# Patient Record
Sex: Male | Born: 1970 | Race: White | Hispanic: No | Marital: Single | Smoking: Current every day smoker
Health system: Southern US, Community
[De-identification: ages and names within clinical notes are randomized; demographics above are authoritative.]

## PROBLEM LIST (undated history)

## (undated) DIAGNOSIS — J101 Influenza due to other identified influenza virus with other respiratory manifestations: Secondary | ICD-10-CM

## (undated) DIAGNOSIS — F419 Anxiety disorder, unspecified: Secondary | ICD-10-CM

## (undated) DIAGNOSIS — F32A Depression, unspecified: Secondary | ICD-10-CM

## (undated) DIAGNOSIS — F329 Major depressive disorder, single episode, unspecified: Secondary | ICD-10-CM

## (undated) HISTORY — PX: TONSILLECTOMY: SUR1361

## (undated) HISTORY — PX: CHOLECYSTECTOMY: SHX55

## (undated) HISTORY — PX: NECK SURGERY: SHX720

## (undated) HISTORY — PX: FRACTURE SURGERY: SHX138

---

## 2006-03-31 ENCOUNTER — Emergency Department: Payer: Self-pay | Admitting: Emergency Medicine

## 2006-04-09 ENCOUNTER — Emergency Department: Payer: Self-pay | Admitting: Emergency Medicine

## 2006-05-25 ENCOUNTER — Emergency Department: Payer: Self-pay | Admitting: Emergency Medicine

## 2007-10-30 ENCOUNTER — Inpatient Hospital Stay: Payer: Self-pay | Admitting: Vascular Surgery

## 2008-02-15 ENCOUNTER — Emergency Department: Payer: Self-pay | Admitting: Emergency Medicine

## 2008-03-21 ENCOUNTER — Emergency Department: Payer: Self-pay | Admitting: Emergency Medicine

## 2011-12-14 ENCOUNTER — Ambulatory Visit: Payer: Self-pay | Admitting: Surgery

## 2011-12-14 LAB — URINALYSIS, COMPLETE
Bacteria: NONE SEEN
Bilirubin,UR: NEGATIVE
Glucose,UR: NEGATIVE mg/dL (ref 0–75)
Ketone: NEGATIVE
Leukocyte Esterase: NEGATIVE
RBC,UR: NONE SEEN /HPF (ref 0–5)
Specific Gravity: 1.001 (ref 1.003–1.030)
Squamous Epithelial: NONE SEEN

## 2011-12-14 LAB — DRUG SCREEN, URINE
Cannabinoid 50 Ng, Ur ~~LOC~~: NEGATIVE (ref ?–50)
Cocaine Metabolite,Ur ~~LOC~~: NEGATIVE (ref ?–300)
MDMA (Ecstasy)Ur Screen: NEGATIVE (ref ?–500)
Methadone, Ur Screen: NEGATIVE (ref ?–300)
Phencyclidine (PCP) Ur S: NEGATIVE (ref ?–25)
Tricyclic, Ur Screen: NEGATIVE (ref ?–1000)

## 2011-12-14 LAB — COMPREHENSIVE METABOLIC PANEL
Albumin: 3.8 g/dL (ref 3.4–5.0)
Alkaline Phosphatase: 65 U/L (ref 50–136)
Anion Gap: 11 (ref 7–16)
BUN: 10 mg/dL (ref 7–18)
Calcium, Total: 8.8 mg/dL (ref 8.5–10.1)
Chloride: 104 mmol/L (ref 98–107)
Creatinine: 1.2 mg/dL (ref 0.60–1.30)
EGFR (Non-African Amer.): >60
Potassium: 3.8 mmol/L (ref 3.5–5.1)
SGOT(AST): 19 U/L (ref 15–37)
SGPT (ALT): 24 U/L
Sodium: 140 mmol/L (ref 136–145)
Total Protein: 7.3 g/dL (ref 6.4–8.2)

## 2011-12-14 LAB — CBC
HCT: 41.5 % (ref 40.0–52.0)
MCH: 33.7 pg (ref 26.0–34.0)
MCHC: 35.5 g/dL (ref 32.0–36.0)
MCV: 95 fL (ref 80–100)
RDW: 11.5 % (ref 11.5–14.5)

## 2011-12-14 LAB — CK TOTAL AND CKMB (NOT AT ARMC)
CK, Total: 77 U/L (ref 35–232)
CK-MB: 0.5 ng/mL (ref 0.5–3.6)

## 2011-12-14 LAB — LIPASE, BLOOD: Lipase: 167 U/L (ref 73–393)

## 2011-12-14 LAB — TROPONIN I: Troponin-I: <0.02 ng/mL

## 2011-12-17 ENCOUNTER — Emergency Department: Payer: Self-pay | Admitting: *Deleted

## 2011-12-17 LAB — COMPREHENSIVE METABOLIC PANEL
Anion Gap: 12 (ref 7–16)
Bilirubin,Total: 1 mg/dL (ref 0.2–1.0)
Calcium, Total: 8.8 mg/dL (ref 8.5–10.1)
Co2: 26 mmol/L (ref 21–32)
Creatinine: 1.14 mg/dL (ref 0.60–1.30)
EGFR (African American): >60
EGFR (Non-African Amer.): >60
Glucose: 81 mg/dL (ref 65–99)
Osmolality: 279 (ref 275–301)
Sodium: 140 mmol/L (ref 136–145)

## 2011-12-17 LAB — CBC
HCT: 41.5 % (ref 40.0–52.0)
HGB: 14.7 g/dL (ref 13.0–18.0)
MCHC: 35.4 g/dL (ref 32.0–36.0)
MCV: 94 fL (ref 80–100)
RBC: 4.4 10*6/uL (ref 4.40–5.90)
WBC: 13.1 10*3/uL — ABNORMAL HIGH (ref 3.8–10.6)

## 2012-02-20 ENCOUNTER — Emergency Department: Payer: Self-pay | Admitting: Internal Medicine

## 2012-02-28 ENCOUNTER — Emergency Department: Payer: Self-pay | Admitting: Emergency Medicine

## 2012-04-01 ENCOUNTER — Emergency Department: Payer: Self-pay | Admitting: Emergency Medicine

## 2012-05-27 ENCOUNTER — Emergency Department: Payer: Self-pay | Admitting: Emergency Medicine

## 2012-05-29 ENCOUNTER — Emergency Department: Payer: Self-pay | Admitting: Emergency Medicine

## 2012-05-31 ENCOUNTER — Emergency Department: Payer: Self-pay | Admitting: Emergency Medicine

## 2012-06-08 ENCOUNTER — Emergency Department: Payer: Self-pay | Admitting: Emergency Medicine

## 2012-10-22 ENCOUNTER — Emergency Department: Payer: Self-pay | Admitting: Emergency Medicine

## 2012-12-15 ENCOUNTER — Emergency Department: Payer: Self-pay | Admitting: Unknown Physician Specialty

## 2012-12-15 LAB — URINALYSIS, COMPLETE
Bilirubin,UR: NEGATIVE
Blood: NEGATIVE
Ketone: NEGATIVE
Leukocyte Esterase: NEGATIVE
Nitrite: NEGATIVE
Ph: 5 (ref 4.5–8.0)
Protein: NEGATIVE
WBC UR: 1 /HPF (ref 0–5)

## 2012-12-15 LAB — CBC
HCT: 44.1 % (ref 40.0–52.0)
MCH: 32.4 pg (ref 26.0–34.0)
Platelet: 241 10*3/uL (ref 150–440)
RBC: 4.67 10*6/uL (ref 4.40–5.90)
WBC: 10.2 10*3/uL (ref 3.8–10.6)

## 2012-12-15 LAB — COMPREHENSIVE METABOLIC PANEL
Albumin: 4 g/dL (ref 3.4–5.0)
Alkaline Phosphatase: 71 U/L (ref 50–136)
Anion Gap: 7 (ref 7–16)
BUN: 12 mg/dL (ref 7–18)
Chloride: 104 mmol/L (ref 98–107)
Co2: 28 mmol/L (ref 21–32)
EGFR (African American): >60
EGFR (Non-African Amer.): >60
Osmolality: 278 (ref 275–301)
SGOT(AST): 15 U/L (ref 15–37)
Sodium: 139 mmol/L (ref 136–145)
Total Protein: 7.6 g/dL (ref 6.4–8.2)

## 2012-12-15 LAB — LIPASE, BLOOD: Lipase: 264 U/L (ref 73–393)

## 2012-12-17 ENCOUNTER — Emergency Department: Payer: Self-pay | Admitting: Emergency Medicine

## 2012-12-24 ENCOUNTER — Emergency Department: Payer: Self-pay | Admitting: Emergency Medicine

## 2015-03-05 ENCOUNTER — Emergency Department
Admit: 2015-03-05 | Disposition: A | Discharge status: Home / Self Care | Payer: Self-pay | Admitting: Emergency Medicine

## 2015-03-05 LAB — BASIC METABOLIC PANEL
Anion Gap: 8 (ref 7–16)
BUN: 17 mg/dL
CREATININE: 0.92 mg/dL
Calcium, Total: 8.3 mg/dL — ABNORMAL LOW
Chloride: 98 mmol/L — ABNORMAL LOW
Co2: 26 mmol/L
EGFR (Non-African Amer.): >60
GLUCOSE: 141 mg/dL — AB
Potassium: 3.6 mmol/L
SODIUM: 132 mmol/L — AB

## 2015-03-05 LAB — CBC
HCT: 40.4 % (ref 40.0–52.0)
HGB: 13.8 g/dL (ref 13.0–18.0)
MCH: 31.4 pg (ref 26.0–34.0)
MCHC: 34.3 g/dL (ref 32.0–36.0)
MCV: 92 fL (ref 80–100)
PLATELETS: 228 10*3/uL (ref 150–440)
RBC: 4.41 10*6/uL (ref 4.40–5.90)
RDW: 12.9 % (ref 11.5–14.5)
WBC: 21.3 10*3/uL — ABNORMAL HIGH (ref 3.8–10.6)

## 2015-03-05 LAB — TROPONIN I: Troponin-I: <0.03 ng/mL

## 2015-03-07 NOTE — H&P (Signed)
PATIENT NAME:  Ruben [name], Ruben [name] MR#:  [id number] DATE OF BIRTH:  17-Oct-1971  DATE OF ADMISSION:  12/14/2011  HISTORY OF PRESENT ILLNESS: Ruben [name] is a 44 year old white male who was awoken this morning at 5:30 a.m. with an excruciating right upper quadrant pain. This has radiated to his back. He has not had any shoulder pain. He was sweaty when he woke up but does not have a documented fever. He had just a tiny bit of nausea with no vomiting. He has had some pain relief with IV morphine in the Emergency Room but says that it is still sore. He has never had an episode similar to this, even of lesser severity or lesser length of time.   PAST MEDICAL HISTORY: No medical illnesses.   MEDICATIONS: None.   ALLERGIES: No known drug allergies.   SOCIAL HISTORY: The patient is divorced and lives with his ex-wife. He does not have any children. 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 He smokes 1 pack of cigarettes per day and used to smoke more frequently than that. He denies any alcohol consumption.   FAMILY HISTORY: Noncontributory.   REVIEW OF SYSTEMS: Negative for 10 systems except as mentioned in the history of present illness above.   PHYSICAL EXAMINATION:  GENERAL: A middle-aged white male lying comfortably on the ED stretcher. Height is 6 feet 2 inches, weight 255 pounds. BMI 32.8.   VITAL SIGNS: Temperature 97.3, pulse 62, respirations 32, blood pressure 116/69, and oxygen saturation 99% on room air.   HEENT: Pupils are equally round and reactive to light. Extraocular movements are intact. Sclerae are anicteric. Oropharynx is clear. Mucous membranes are moist.   NECK: Supple with no lymphadenopathy. The trachea is midline, and there is no jugular venous distention.   HEART: Regular rate and rhythm with no murmurs or rubs.   LUNGS: Clear to auscultation with normal respiratory effort bilaterally.   ABDOMEN: Tender in the right upper  quadrant with no rebound tenderness or guarding.   EXTREMITIES: No edema with normal capillary refill bilaterally.   NEUROLOGIC: Cranial nerves II through XII, motor and sensation are grossly intact.   PSYCHIATRIC: Alert and oriented x4. Appropriate affect.   LABORATORY, DIAGNOSTIC AND RADIOLOGICAL DATA:  Urinalysis is normal.  Electrolytes, lipase, hepatic profile, CPK-MB, troponin, and CBC are all normal.  Chest x-ray is clear.  Right upper quadrant ultrasound reveals gallbladder wall thickening of 3.2 mm with no sonographic Murphy's sign, a common bile duct at 4.1 mm, and no pericholecystic fluid but lots of gallstones.   ASSESSMENT: Possible hydrops of the gallbladder or acute cholecystitis.   PLAN: Admit to the hospital for laparoscopic cholecystectomy. The patient understands the risk of a 1 in 200 chance of a common bile duct injury and realizes that if such an injury were to occur it would be a serious complication and require further surgery. He consents to the operation.   ____________________________ Ruben MangesWilliam F. Merit Maybee, MD wfm:cbb [name]: 12/14/2011 11:01:58 ET T: 12/14/2011 11:18:05 ET JOB#: [id number]  cc: Ruben MangesWilliam F. Ruben Gervase, MD, <Dictator> Ruben MangesWILLIAM F Gwendola Hornaday MD ELECTRONICALLY SIGNED 12/14/2011 12:27

## 2015-03-07 NOTE — Op Note (Signed)
PATIENT NAME:  Ruben [name], Ruben [name] MR#:  [id number] DATE OF BIRTH:  17-Sep-1971  DATE OF PROCEDURE:  12/14/2011  PREOPERATIVE DIAGNOSIS:  Acute cholecystitis.  POSTOPERATIVE DIAGNOSIS:  Acute cholecystitis.  OPERATION PERFORMED: Laparoscopic cholecystectomy.   SURGEON: Claude MangesWilliam F Chivas Notz, M.[name].   ANESTHESIA: General.   PROCEDURE IN DETAIL: The patient was placed supine on the operating room table and prepped and draped in the usual sterile fashion. A 15-mmHg CO2 pneumoperitoneum was created via a Veress needle in the infraumbilical position and this was replaced with a 5-mm trocar and a 30-degree angled laparoscope. Remaining trocars were placed under direct visualization. The patient was noted to have multiple accessory lobules on the visceral surface of his liver and his gallbladder was very large, mostly intrahepatic, and packed full of stones. There was significant edema in the triangle of Calot. The fundus of the gallbladder was retracted superiorly and ventrally. The infundibulum of the gallbladder was retracted laterally in order to dissect the triangle of Calot. The lymph node in the triangle was much larger than usual and it was included with the specimen, although it appeared to be fairly soft. Dissection within the triangle revealed a cystic duct/infundibular junction and the cystic duct was doubly clipped and divided. There were multiple small cystic artery branches. There was an anterior one, a posterior one, and then a third that was even more posterior. Each of these was doubly clipped and divided. The gallbladder was removed from the liver bed with electrocautery, placed in an Endo Catch bag, and extracted from the abdomen via the epigastric port. This port site fascia required significant enlargement due to the fact that the gallbladder was so widely packed full of stones. The peritoneum was temporarily desufflated and the fascia was closed with running #1 PDS suture. Then the peritoneum was  re-insufflated and the right upper quadrant inspected. Hemostasis was excellent. The area was irrigated with copious amounts of warm normal saline and this was all suctioned clear. The clips were secure. Unfortunately, 2 to 3 gallstones were left inside the patient as I had already removed my large port and they would not fit through the 5 mm ports. Each was about 1 cm in diameter. The peritoneum was then desufflated and decannulated and the midline epigastric incision was closed with staples and a sterile dressing. The other three 5-mm trocar sites were closed with subcuticular 5-0 Monocryl, Mastisol, and Steri-Strips. The patient tolerated the procedure well. There were no complications.     ____________________________ Claude MangesWilliam F. Jonna Dittrich, MD wfm:bjt [name]: 12/14/2011 16:03:02 ET T: 12/15/2011 10:59:19 ET JOB#: [id number]  cc: Claude MangesWilliam F. Husein Guedes, MD, <Dictator> Claude MangesWILLIAM F Estanislao Harmon MD ELECTRONICALLY SIGNED 12/21/2011 20:14

## 2015-03-07 NOTE — Consult Note (Signed)
PATIENT NAME:  Ruben [name], Ruben [name] MR#:  [id number] DATE OF BIRTH:  [birth year]  DATE OF CONSULTATION:  12/17/2011  REFERRING PHYSICIAN:   CONSULTING PHYSICIAN:  Carmie Endalph L. Ely III, MD  BRIEF HISTORY: Ruben [name] is a 44 year old gentleman with acute cholecystitis admitted to the hospital on 12/14/2011. He underwent laparoscopic cholecystectomy which identified significant inflammatory changes in his gallbladder. Cholangiogram was not performed. He did well initially with some moderate pain control issues. He has history of possible cocaine usage and his pain medication requirements had been quite high. He took a significant amount of medication while in the hospital and was discharged home on 12/15/2011. He returned to the Emergency Room today complaining of constipation. He has been unable to move his bowels over the last 48 hours. He lost his pain medication as his dog ate his pain medicine. Work-up in the Emergency Room revealed a slightly elevated white blood cell count and otherwise, his laboratory values were unremarkable. Plain films revealed an ileus pattern versus low partial small bowel obstruction.   PAST MEDICAL HISTORY: Unremarkable.   PAST SURGICAL HISTORY:  He has had no previous surgery.   MEDICATIONS: He takes no regular medications.   ALLERGIES: No known drug allergies.  SOCIAL HISTORY: He continues to smoke regularly. He is currently unemployed. He denies any alcohol consumption, but does use illicit drugs intermittently.   PHYSICAL EXAMINATION:  GENERAL: He is an alert and comfortable gentleman watching a movie on the computer when I entered the room.   VITAL SIGNS: Stable with a blood pressure 141/74, heart rate of 93, temperature of 96.4.   HEENT: Examination is unremarkable.   LYMPH: He has no axillary or cervical adenopathy.   CHEST: Clear with no adventitious sounds. He has normal pulmonary excursion.   CARDIAC: No murmurs or gallops to my ear and he seems to be in  normal sinus rhythm.   ABDOMEN: His abdomen is soft with well-healing midline wounds. There is no sign of any abdominal tenderness. He does not have any tympany or distention. He does have active bowel sounds.   LOWER EXTREMITY: Unremarkable.   PSYCHIATRIC: Normal affect and normal orientation. He does seem upset that he is having this problem.    IMPRESSION AND RECOMMENDATIONS: I have independently reviewed his laboratory values and his plain films. His white blood cell count is 13,000. Otherwise, his laboratories are unremarkable. Plain films do show an ileus versus low large bowel obstruction pattern. I do not see any indication clinically that he is an obstruction. I suspect that he simply has some obstipation from his chronic narcotic use. We will plan to give him some cathartics, enema if necessary and follow him up in the office. I have recommended that he reduce his pain medication consumption if possible, but at the present time he says his pain is intolerable. We will write him another prescription for Percocet.    ____________________________ Carmie Endalph L. Ely III, MD rle:ap [name]: 12/17/2011 10:10:51 ET T: 12/17/2011 10:55:41 ET JOB#: [id number]  cc: Quentin Orealph L. Ely III, MD, <Dictator> [name] ELECTRONICALLY SIGNED 12/24/2011 21:14

## 2015-10-06 ENCOUNTER — Emergency Department: Payer: Managed Care, Other (non HMO)

## 2015-10-06 ENCOUNTER — Emergency Department
Admission: EM | Admit: 2015-10-06 | Discharge: 2015-10-06 | Disposition: A | Discharge status: Home / Self Care | Payer: Managed Care, Other (non HMO) | Attending: Student | Admitting: Student

## 2015-10-06 ENCOUNTER — Encounter: Payer: Self-pay | Admitting: Emergency Medicine

## 2015-10-06 DIAGNOSIS — Y9389 Activity, other specified: Secondary | ICD-10-CM | POA: Insufficient documentation

## 2015-10-06 DIAGNOSIS — X58XXXA Exposure to other specified factors, initial encounter: Secondary | ICD-10-CM | POA: Diagnosis not present

## 2015-10-06 DIAGNOSIS — R109 Unspecified abdominal pain: Secondary | ICD-10-CM | POA: Diagnosis present

## 2015-10-06 DIAGNOSIS — Y998 Other external cause status: Secondary | ICD-10-CM | POA: Diagnosis not present

## 2015-10-06 DIAGNOSIS — Y9289 Other specified places as the place of occurrence of the external cause: Secondary | ICD-10-CM | POA: Insufficient documentation

## 2015-10-06 DIAGNOSIS — Z79899 Other long term (current) drug therapy: Secondary | ICD-10-CM | POA: Insufficient documentation

## 2015-10-06 DIAGNOSIS — F17210 Nicotine dependence, cigarettes, uncomplicated: Secondary | ICD-10-CM | POA: Diagnosis not present

## 2015-10-06 DIAGNOSIS — S39012A Strain of muscle, fascia and tendon of lower back, initial encounter: Secondary | ICD-10-CM

## 2015-10-06 LAB — CBC
HEMATOCRIT: 41.7 % (ref 40.0–52.0)
Hemoglobin: 14.5 g/dL (ref 13.0–18.0)
MCH: 32.3 pg (ref 26.0–34.0)
MCHC: 34.6 g/dL (ref 32.0–36.0)
MCV: 93.2 fL (ref 80.0–100.0)
PLATELETS: 199 10*3/uL (ref 150–440)
RBC: 4.48 MIL/uL (ref 4.40–5.90)
RDW: 12.7 % (ref 11.5–14.5)
WBC: 8.3 10*3/uL (ref 3.8–10.6)

## 2015-10-06 LAB — URINALYSIS COMPLETE WITH MICROSCOPIC (ARMC ONLY)
BILIRUBIN URINE: NEGATIVE
Bacteria, UA: NONE SEEN
Glucose, UA: NEGATIVE mg/dL
Hgb urine dipstick: NEGATIVE
Ketones, ur: NEGATIVE mg/dL
Leukocytes, UA: NEGATIVE
Nitrite: NEGATIVE
Protein, ur: NEGATIVE mg/dL
Specific Gravity, Urine: 1.021 (ref 1.005–1.030)
Squamous Epithelial / LPF: NONE SEEN
pH: 5 (ref 5.0–8.0)

## 2015-10-06 LAB — BASIC METABOLIC PANEL
Anion gap: 5 (ref 5–15)
BUN: 10 mg/dL (ref 6–20)
CO2: 26 mmol/L (ref 22–32)
Calcium: 8.8 mg/dL — ABNORMAL LOW (ref 8.9–10.3)
Chloride: 106 mmol/L (ref 101–111)
Creatinine, Ser: 0.74 mg/dL (ref 0.61–1.24)
GFR calc Af Amer: >60 mL/min (ref >60)
Glucose, Bld: 135 mg/dL — ABNORMAL HIGH (ref 65–99)
POTASSIUM: 3.5 mmol/L (ref 3.5–5.1)
Sodium: 137 mmol/L (ref 135–145)

## 2015-10-06 MED ORDER — ONDANSETRON HCL 4 MG/2ML IJ SOLN
4.0000 mg | Freq: Once | INTRAMUSCULAR | Status: AC
  Administered 2015-10-06: 4 mg via INTRAVENOUS
  Filled 2015-10-06: qty 2

## 2015-10-06 MED ORDER — SODIUM CHLORIDE 0.9 % IV BOLUS (SEPSIS)
1000.0000 mL | Freq: Once | INTRAVENOUS | Status: AC
  Administered 2015-10-06: 1000 mL via INTRAVENOUS

## 2015-10-06 MED ORDER — IBUPROFEN 600 MG PO TABS: 600.0000 mg | ORAL_TABLET | Freq: Four times a day (QID) | ORAL | Status: DC | PRN

## 2015-10-06 MED ORDER — KETOROLAC TROMETHAMINE 30 MG/ML IJ SOLN
30.0000 mg | Freq: Once | INTRAMUSCULAR | Status: AC
Start: 2015-10-06 — End: 2015-10-06
  Administered 2015-10-06: 30 mg via INTRAVENOUS
  Filled 2015-10-06: qty 1

## 2015-10-06 NOTE — ED Provider Notes (Signed)
[facility] Emergency Department Provider Note  ____________________________________________  Time seen: Approximately 11:50 AM  I have reviewed the triage vital signs and the nursing notes.   HISTORY  Chief Complaint Flank Pain    HPI Hadyn Blanck. is a 44 y.o. male with no chronic medical problem presents for evaluation of gradual onset left flank pain radiating to the left abdomen since yesterday, gradual onset, constant since onset, worse with movement, currently moderate. No nausea, vomiting, diarrhea, fevers or chills. No pain or burning with urination, no hematuria. No similar symptoms previously. No trauma. No numbness or weakness, no bowel or bladder incontinence, no personal history of malignancy, no history of IV drug use.   History reviewed. No pertinent past medical history.  There are no active problems to display for this patient.   Past Surgical History  Procedure Laterality Date  . Cholecystectomy    . Tonsillectomy      Current Outpatient Rx  Name  Route  Sig  Dispense  Refill  . busPIRone (BUSPAR) 10 MG tablet   Oral   Take 20 mg by mouth 2 (two) times daily.         Marland Kitchen FLUoxetine (PROZAC) 40 MG capsule   Oral   Take 40 mg by mouth at bedtime.         . risperiDONE (RISPERDAL) 1 MG tablet   Oral   Take 1 mg by mouth at bedtime.           Allergies Review of patient's allergies indicates no known allergies.  Family History  Problem Relation Age of Onset  . Diabetes Mother   . Diabetes Father     Social History Social History  Substance Use Topics  . Smoking status: Current Every Day Smoker -- 0.75 packs/day    Types: Cigarettes  . Smokeless tobacco: None  . Alcohol Use: No     Comment: quit drinking and using illegal drugs in 2013    Review of Systems Constitutional: No fever/chills Eyes: No visual changes. ENT: No sore throat. Cardiovascular: Denies chest pain. Respiratory: Denies shortness of  breath. Gastrointestinal: No abdominal pain.  No nausea, no vomiting.  No diarrhea.  No constipation. Genitourinary: Negative for dysuria. Musculoskeletal: Positive for left flank pain. Skin: Negative for rash. Neurological: Negative for headaches, focal weakness or numbness.  10-point ROS otherwise negative.  ____________________________________________   PHYSICAL EXAM:  VITAL SIGNS: ED Triage Vitals  Enc Vitals Group     BP 10/06/15 1105 127/79 mmHg     Pulse Rate 10/06/15 1105 79     Resp 10/06/15 1105 20     Temp 10/06/15 1105 98.2 F (36.8 C)     Temp Source 10/06/15 1105 Oral     SpO2 10/06/15 1105 94 %     Weight 10/06/15 1105 283 lb (128.368 kg)     Height 10/06/15 1105  (1.88 m)     Head Cir --      Peak Flow --      Pain Score 10/06/15 1106 7     Pain Loc --      Pain Edu? --      Excl. in GC? --     Constitutional: Alert and oriented. Well appearing and in no acute distress. Eyes: Conjunctivae are normal. PERRL. EOMI. Head: Atraumatic. Nose: No congestion/rhinnorhea. Mouth/Throat: Mucous membranes are moist.  Oropharynx non-erythematous. Neck: No stridor.   Cardiovascular: Normal rate, regular rhythm. Grossly normal heart sounds.  Good peripheral circulation.  Respiratory: Normal respiratory effort.  No retractions. Lungs CTAB. Gastrointestinal: Soft and nontender. No distention.   Genitourinary: deferred Musculoskeletal: No lower extremity tenderness nor edema.  No joint effusions. Severe tenderness to palpation throughout the left paravertebral muscles associated with the lumbar spine, no midline tenderness to palpation. Neurologic:  Normal speech and language. No gross focal neurologic deficits are appreciated. No gait instability. Normal dorsiflexion of the hallux toes bilaterally. Skin:  Skin is warm, dry and intact. No rash noted. Psychiatric: Mood and affect are normal. Speech and behavior are  normal.  ____________________________________________   LABS (all labs ordered are listed, but only abnormal results are displayed)  Labs Reviewed  BASIC METABOLIC PANEL - Abnormal; Notable for the following:    Glucose, Bld 135 (*)    Calcium 8.8 (*)    All other components within normal limits  URINALYSIS COMPLETEWITH MICROSCOPIC ([facility] ONLY) - Abnormal; Notable for the following:    Color, Urine YELLOW (*)    APPearance CLEAR (*)    All other components within normal limits  CBC   ____________________________________________  EKG  none ____________________________________________  RADIOLOGY  Renal ultrasound IMPRESSION: Normal appearance of both kidneys. No evidence of hydronephrosis.  Hepatic steatosis incidentally noted.  ____________________________________________   PROCEDURES  Procedure(s) performed: None  Critical Care performed: No  ____________________________________________   INITIAL IMPRESSION / ASSESSMENT AND PLAN / ED COURSE  Pertinent labs & imaging results that were available during my care of the patient were reviewed by me and considered in my medical decision making (see chart for details).  Darrelyn HillockGerald D Giammona Jr. is a 44 y.o. male with no chronic medical problem presents for evaluation of gradual onset left flank pain radiating to the left abdomen since yesterday, gradual onset, constant since onset, worse with movement. On exam, he is generally well-appearing and in no acute distress. Vital signs stable, he is afebrile. He does have tenderness to palpation throughout the paravertebral muscles of the left lumbar spine and I suspect his symptoms are secondary to lumbar strain. No red flags concerning for cauda equina or epidural abscess. He has normal strength exam in the legs bilaterally. No midline tenderness throughout the lumbar spine. He is afebrile. Differential also includes possible kidney stone. Urinalysis consistent with infection and  there are very few red blood cells making this less likely however will obtain renal ultrasound. We'll treat his pain. Reassess for disposition.  ----------------------------------------- 2:24 PM on 10/06/2015 -----------------------------------------  Labs reviewed. Normal CBC and BMP. Ultrasound negative for any evidence of hydronephrosis, normal appearance of the kidneys is noted. He appears well, he is hungry and requesting a sandwich. Discussed treatment of lumbar strain with anti-inflammatories, return precautions and need for close follow-up and he is comfortable with the discharge plan. ____________________________________________   FINAL CLINICAL IMPRESSION(S) / ED DIAGNOSES  Final diagnoses:  Lumbar strain, initial encounter      Gayla DossEryka A Macenzie Burford, MD 10/06/15 1544

## 2015-10-06 NOTE — ED Notes (Signed)
Pt c/o left flank pain that radiates to the front since yesterday with nausea.. Denies HX of kidney stones or vomiting

## 2016-02-15 ENCOUNTER — Emergency Department: Payer: Managed Care, Other (non HMO)

## 2016-02-15 ENCOUNTER — Encounter: Payer: Self-pay | Admitting: Emergency Medicine

## 2016-02-15 ENCOUNTER — Emergency Department
Admission: EM | Admit: 2016-02-15 | Discharge: 2016-02-15 | Disposition: A | Discharge status: Home / Self Care | Payer: Managed Care, Other (non HMO) | Attending: Emergency Medicine | Admitting: Emergency Medicine

## 2016-02-15 DIAGNOSIS — Z79899 Other long term (current) drug therapy: Secondary | ICD-10-CM | POA: Insufficient documentation

## 2016-02-15 DIAGNOSIS — F17210 Nicotine dependence, cigarettes, uncomplicated: Secondary | ICD-10-CM | POA: Diagnosis not present

## 2016-02-15 DIAGNOSIS — R05 Cough: Secondary | ICD-10-CM | POA: Diagnosis present

## 2016-02-15 DIAGNOSIS — J111 Influenza due to unidentified influenza virus with other respiratory manifestations: Secondary | ICD-10-CM | POA: Insufficient documentation

## 2016-02-15 DIAGNOSIS — R52 Pain, unspecified: Secondary | ICD-10-CM | POA: Insufficient documentation

## 2016-02-15 DIAGNOSIS — Z791 Long term (current) use of non-steroidal anti-inflammatories (NSAID): Secondary | ICD-10-CM | POA: Diagnosis not present

## 2016-02-15 MED ORDER — ACETAMINOPHEN 500 MG PO TABS
1000.0000 mg | ORAL_TABLET | Freq: Once | ORAL | Status: AC
  Administered 2016-02-15: 1000 mg via ORAL

## 2016-02-15 MED ORDER — ACETAMINOPHEN 500 MG PO TABS
ORAL_TABLET | ORAL | Status: AC
  Administered 2016-02-15: 1000 mg via ORAL
  Filled 2016-02-15: qty 2

## 2016-02-15 MED ORDER — OSELTAMIVIR PHOSPHATE 75 MG PO CAPS: 75.0000 mg | ORAL_CAPSULE | Freq: Two times a day (BID) | ORAL | Status: DC

## 2016-02-15 MED ORDER — PREDNISONE 10 MG PO TABS: 50.0000 mg | ORAL_TABLET | Freq: Every day | ORAL | Status: DC

## 2016-02-15 MED ORDER — ALBUTEROL SULFATE HFA 108 (90 BASE) MCG/ACT IN AERS: 2.0000 | INHALATION_SPRAY | Freq: Four times a day (QID) | RESPIRATORY_TRACT | Status: DC | PRN

## 2016-02-15 NOTE — ED Provider Notes (Signed)
CSN: [id number]     Arrival date & time 02/15/16  1617 History   First MD Initiated Contact with Patient 02/15/16 1643     Chief Complaint  Patient presents with  . Fever     HPI   45 year old male who complains of body aches with cough and headache since lunchtime today presents to the emergency department for evaluation.  History reviewed. No pertinent past medical history. Past Surgical History  Procedure Laterality Date  . Cholecystectomy    . Tonsillectomy     Family History  Problem Relation Age of Onset  . Diabetes Mother   . Diabetes Father    Social History  Substance Use Topics  . Smoking status: Current Every Day Smoker -- 0.75 packs/day    Types: Cigarettes  . Smokeless tobacco: None  . Alcohol Use: No     Comment: quit drinking and using illegal drugs in 2013    Review of Systems  Constitutional: Positive for fever, chills and fatigue.  HENT: Positive for rhinorrhea, sinus pressure, sneezing and sore throat.   Respiratory: Positive for cough. Negative for shortness of breath.   Gastrointestinal: Positive for nausea, vomiting and diarrhea.  Skin: Negative.       Allergies  Review of patient's allergies indicates no known allergies.  Home Medications   Prior to Admission medications   Medication Sig Start Date End Date Taking? Authorizing Provider  albuterol (PROVENTIL HFA;VENTOLIN HFA) 108 (90 Base) MCG/ACT inhaler Inhale 2 puffs into the lungs every 6 (six) hours as needed for wheezing or shortness of breath. 02/15/16   Chinita Pester, FNP  busPIRone (BUSPAR) 10 MG tablet Take 20 mg by mouth 2 (two) times daily.    Historical Provider, MD  FLUoxetine (PROZAC) 40 MG capsule Take 40 mg by mouth at bedtime.    Historical Provider, MD  ibuprofen (ADVIL,MOTRIN) 600 MG tablet Take 1 tablet (600 mg total) by mouth every 6 (six) hours as needed for moderate pain. 10/06/15   Gayla Doss, MD  oseltamivir (TAMIFLU) 75 MG capsule Take 1 capsule (75 mg total) by  mouth 2 (two) times daily. 02/15/16   Chinita Pester, FNP  predniSONE (DELTASONE) 10 MG tablet Take 5 tablets (50 mg total) by mouth daily. 02/15/16   Chinita Pester, FNP  risperiDONE (RISPERDAL) 1 MG tablet Take 1 mg by mouth at bedtime.    Historical Provider, MD   BP 120/53 mmHg  Pulse 120  Temp(Src) 101.6 F (38.7 C) (Oral)  Resp 20  Ht  (1.88 m)  Wt 128.368 kg  BMI 36.32 kg/m2  SpO2 98% Physical Exam  Constitutional: He is oriented to person, place, and time. He appears well-developed and well-nourished.  HENT:  Head: Normocephalic.  Right Ear: Tympanic membrane and external ear normal.  Left Ear: Tympanic membrane and external ear normal.  Nose: Mucosal edema present.  Mouth/Throat: Uvula is midline. No uvula swelling. Posterior oropharyngeal erythema present. No oropharyngeal exudate or tonsillar abscesses.  Eyes: Conjunctivae and EOM are normal.  Neck: Normal range of motion.  Cardiovascular: Normal rate and regular rhythm.   Pulmonary/Chest: Effort normal. He has wheezes.  Abdominal: Soft. There is no tenderness. There is no rebound and no guarding.  Musculoskeletal: Normal range of motion.  Lymphadenopathy:    He has cervical adenopathy.  Neurological: He is alert and oriented to person, place, and time.  Skin: Skin is warm and dry. He is not diaphoretic.  Psychiatric: His behavior is normal.  Nursing note  and vitals reviewed.   ED Course  Procedures (including critical care time) Labs Review Labs Reviewed - No data to display  Imaging Review Dg Chest 2 View  02/15/2016  CLINICAL DATA:  45 year old presenting with acute onset of cough, headache, fever and myalgias which began earlier today. EXAM: CHEST  2 VIEW COMPARISON:  03/05/2015 and earlier. FINDINGS: Cardiomediastinal silhouette unremarkable, unchanged. Prominent bronchovascular markings diffusely and moderate central peribronchial thickening, similar to the April, 2016 examination. Linear scarring in the  left lower lobe the site of the prior pneumonia. Lungs otherwise clear. No localized airspace consolidation. No pleural effusions. No pneumothorax. Normal pulmonary vascularity. Mild degenerative disc disease and spondylosis involving the mid lower thoracic spine. IMPRESSION: No acute cardiopulmonary disease. Stable moderate changes of chronic bronchitis and/or asthma. Linear scarring in the left lower lobe. Electronically Signed   By: Hulan Saashomas  Lawrence M.D.   On: 02/15/2016 17:15   I have personally reviewed and evaluated these images and lab results as part of my medical decision-making.   EKG Interpretation None      MDM   Final diagnoses:  Bronchitis with influenza    Because patient is an asthmatic and smokes cigarettes he will be started on Tamiflu and given prednisone and albuterol. He was advised that he needs to establish a primary care provider for follow-up. He was encouraged to take Tylenol or ibuprofen to help with body aches and fever. He was advised to return to emergency department for symptoms that change or worsen if he is unable to schedule an appointment.    Chinita PesterCari B Lajuana Patchell, FNP 02/15/16 2348  Sharman CheekPhillip Stafford, MD 02/17/16 [phone]

## 2016-02-15 NOTE — ED Notes (Signed)
Developed body aches with cough and headache and fever since lunch time

## 2016-02-15 NOTE — Discharge Instructions (Signed)

## 2016-12-02 ENCOUNTER — Emergency Department
Admission: EM | Admit: 2016-12-02 | Discharge: 2016-12-02 | Disposition: A | Discharge status: Home / Self Care | Payer: Managed Care, Other (non HMO) | Attending: Emergency Medicine | Admitting: Emergency Medicine

## 2016-12-02 ENCOUNTER — Encounter: Payer: Self-pay | Admitting: Emergency Medicine

## 2016-12-02 ENCOUNTER — Emergency Department: Payer: Managed Care, Other (non HMO)

## 2016-12-02 DIAGNOSIS — Z79899 Other long term (current) drug therapy: Secondary | ICD-10-CM | POA: Insufficient documentation

## 2016-12-02 DIAGNOSIS — J40 Bronchitis, not specified as acute or chronic: Secondary | ICD-10-CM | POA: Insufficient documentation

## 2016-12-02 DIAGNOSIS — F17210 Nicotine dependence, cigarettes, uncomplicated: Secondary | ICD-10-CM | POA: Insufficient documentation

## 2016-12-02 DIAGNOSIS — R079 Chest pain, unspecified: Secondary | ICD-10-CM

## 2016-12-02 LAB — CBC
HEMATOCRIT: 44.4 % (ref 40.0–52.0)
HEMOGLOBIN: 15.6 g/dL (ref 13.0–18.0)
MCH: 32.1 pg (ref 26.0–34.0)
MCHC: 35.1 g/dL (ref 32.0–36.0)
MCV: 91.5 fL (ref 80.0–100.0)
Platelets: 210 10*3/uL (ref 150–440)
RBC: 4.85 MIL/uL (ref 4.40–5.90)
RDW: 12.8 % (ref 11.5–14.5)
WBC: 10.9 10*3/uL — AB (ref 3.8–10.6)

## 2016-12-02 LAB — BASIC METABOLIC PANEL
ANION GAP: 5 (ref 5–15)
BUN: 10 mg/dL (ref 6–20)
CHLORIDE: 106 mmol/L (ref 101–111)
CO2: 27 mmol/L (ref 22–32)
Calcium: 8.9 mg/dL (ref 8.9–10.3)
Creatinine, Ser: 0.94 mg/dL (ref 0.61–1.24)
GFR calc Af Amer: >60 mL/min (ref >60)
Glucose, Bld: 112 mg/dL — ABNORMAL HIGH (ref 65–99)
POTASSIUM: 4.3 mmol/L (ref 3.5–5.1)
SODIUM: 138 mmol/L (ref 135–145)

## 2016-12-02 LAB — TROPONIN I: Troponin I: <0.03 ng/mL (ref <0.03)

## 2016-12-02 MED ORDER — AZITHROMYCIN 500 MG PO TABS
500.0000 mg | ORAL_TABLET | Freq: Once | ORAL | Status: AC
  Administered 2016-12-02: 500 mg via ORAL
  Filled 2016-12-02: qty 1

## 2016-12-02 MED ORDER — AZITHROMYCIN 250 MG PO TABS: 250.0000 mg | ORAL_TABLET | Freq: Every day | ORAL | 0 refills | Status: DC

## 2016-12-02 MED ORDER — GUAIFENESIN-CODEINE 100-10 MG/5ML PO SOLN
5.0000 mL | Freq: Four times a day (QID) | ORAL | 0 refills | Status: DC | PRN
Start: 2016-12-02 — End: 2019-02-10

## 2016-12-02 MED ORDER — HYDROCOD POLST-CPM POLST ER 10-8 MG/5ML PO SUER
5.0000 mL | Freq: Once | ORAL | Status: AC
  Administered 2016-12-02: 5 mL via ORAL
  Filled 2016-12-02: qty 5

## 2016-12-02 NOTE — ED Notes (Signed)

## 2016-12-02 NOTE — ED Triage Notes (Signed)
Patient c/o chest pain that started this morning around 0830. Patient states that he also has cough and shortness of breath. Patient states that the pain is worse when he cough. Denies fevers or chills, has been around sick contacts. Patient is in NAD at this time, breathing is equal and unlabored, color WNL. Patient A & O x 4

## 2016-12-02 NOTE — ED Provider Notes (Signed)
[facility] Emergency Department Provider Note  Time seen: 1:21 PM  I have reviewed the triage vital signs and the nursing notes.   HISTORY  Chief Complaint Chest Pain    HPI Ruben [name] is a 46 y.o. male presents the emergency department for shortness of breath, cough and left-sided chest pain with cough. According to the patient for the past 3 days he has been coughing, states a dry cough with no sputum production. Denies fever. States significant chest pain when coughing. Patient does smoke approximately one pack of cigarettes per day. Denies any nausea or diaphoresis. Denies any leg pain or swelling.  History reviewed. No pertinent past medical history.  There are no active problems to display for this patient.   Past Surgical History:  Procedure Laterality Date  . CHOLECYSTECTOMY    . TONSILLECTOMY      Prior to Admission medications   Medication Sig Start Date End Date Taking? Authorizing Provider  albuterol (PROVENTIL HFA;VENTOLIN HFA) 108 (90 Base) MCG/ACT inhaler Inhale 2 puffs into the lungs every 6 (six) hours as needed for wheezing or shortness of breath. 02/15/16   Chinita Pesterari B Triplett, FNP  busPIRone (BUSPAR) 10 MG tablet Take 20 mg by mouth 2 (two) times daily.    Historical Provider, MD  FLUoxetine (PROZAC) 40 MG capsule Take 40 mg by mouth at bedtime.    Historical Provider, MD  ibuprofen (ADVIL,MOTRIN) 600 MG tablet Take 1 tablet (600 mg total) by mouth every 6 (six) hours as needed for moderate pain. 10/06/15   Gayla DossEryka A Gayle, MD  oseltamivir (TAMIFLU) 75 MG capsule Take 1 capsule (75 mg total) by mouth 2 (two) times daily. 02/15/16   Chinita Pesterari B Triplett, FNP  predniSONE (DELTASONE) 10 MG tablet Take 5 tablets (50 mg total) by mouth daily. 02/15/16   Chinita Pesterari B Triplett, FNP  risperiDONE (RISPERDAL) 1 MG tablet Take 1 mg by mouth at bedtime.    Historical Provider, MD    No Known Allergies  Family History  Problem Relation Age of Onset  . Diabetes  Mother   . Diabetes Father     Social History Social History  Substance Use Topics  . Smoking status: Current Every Day Smoker    Packs/day: 0.75    Types: Cigarettes  . Smokeless tobacco: Never Used  . Alcohol use No     Comment: quit drinking and using illegal drugs in 2013    Review of Systems Constitutional: Negative for fever Cardiovascular: Positive for chest pain with cough. Respiratory: Negative for shortness of breath. Positive for cough. Gastrointestinal: Negative for abdominal pain Musculoskeletal: Negative for back pain Neurological: Negative for headache 10-point ROS otherwise negative.  ____________________________________________   PHYSICAL EXAM:  VITAL SIGNS: ED Triage Vitals  Enc Vitals Group     BP 12/02/16 0954 119/76     Pulse Rate 12/02/16 0954 81     Resp 12/02/16 0954 20     Temp 12/02/16 0954 98.2 F (36.8 C)     Temp Source 12/02/16 0954 Oral     SpO2 12/02/16 0954 97 %     Weight 12/02/16 0953 264 lb (119.7 kg)     Height 12/02/16 0953 6\' 2"  (1.88 m)     Head Circumference --      Peak Flow --      Pain Score 12/02/16 0953 6     Pain Loc --      Pain Edu? --      Excl. in  GC? --     Constitutional: Alert and oriented. Well appearing and in no distress. Eyes: Normal exam ENT   Head: Normocephalic and atraumatic   Mouth/Throat: Mucous membranes are moist. Cardiovascular: Normal rate, regular rhythm. No murmur Respiratory: Normal respiratory effort without tachypnea nor retractions. Breath sounds are clear and equal bilaterally. No wheezes/rales/rhonchi. Gastrointestinal: Soft and nontender. No distention.   Musculoskeletal: Nontender with normal range of motion in all extremities.  Neurologic:  Normal speech and language. No gross focal neurologic deficits Skin:  Skin is warm, dry and intact.  Psychiatric: Mood and affect are normal.   ____________________________________________    EKG  EKG reviewed and interpreted by  myself shows normal sinus rhythm at 84 bpm. Narrow QRS, normal axis, normal intervals, no CT changes. Normal EKG.  ____________________________________________    RADIOLOGY  Chest x-ray negative  ____________________________________________   INITIAL IMPRESSION / ASSESSMENT AND PLAN / ED COURSE  Pertinent labs & imaging results that were available during my care of the patient were reviewed by me and considered in my medical decision making (see chart for details).  Patient presents the emergency department with cough chest pain with cough and shortness of breath. Patient lung sounds are overall clear. Patient does have a frequent dry cough during examination. Suspect possible bronchitis. We will cover with antibiotics and cough medication. Patient agreeable to plan. Labs are normal including troponin.  ____________________________________________   FINAL CLINICAL IMPRESSION(S) / ED DIAGNOSES  Bronchitis    Minna Antis, MD 12/02/16 1334

## 2016-12-02 NOTE — Discharge Instructions (Signed)
You have been seen in the emergency department today for chest pain. Your workup has shown normal results and is most consistent with bronchitis. As we discussed please follow-up with your primary care physician in the next 1-2 days for recheck. Return to the emergency department for any further chest pain, trouble breathing, or any other symptom personally concerning to yourself. °

## 2016-12-03 ENCOUNTER — Encounter: Payer: Self-pay | Admitting: Emergency Medicine

## 2016-12-03 ENCOUNTER — Observation Stay
Admission: EM | Admit: 2016-12-03 | Discharge: 2016-12-05 | Disposition: A | Discharge status: Home / Self Care | Payer: Self-pay | Attending: Internal Medicine | Admitting: Internal Medicine

## 2016-12-03 ENCOUNTER — Emergency Department: Payer: Self-pay

## 2016-12-03 DIAGNOSIS — R000 Tachycardia, unspecified: Secondary | ICD-10-CM | POA: Insufficient documentation

## 2016-12-03 DIAGNOSIS — J440 Chronic obstructive pulmonary disease with acute lower respiratory infection: Secondary | ICD-10-CM | POA: Insufficient documentation

## 2016-12-03 DIAGNOSIS — F17210 Nicotine dependence, cigarettes, uncomplicated: Secondary | ICD-10-CM | POA: Insufficient documentation

## 2016-12-03 DIAGNOSIS — Z79899 Other long term (current) drug therapy: Secondary | ICD-10-CM | POA: Insufficient documentation

## 2016-12-03 DIAGNOSIS — J101 Influenza due to other identified influenza virus with other respiratory manifestations: Secondary | ICD-10-CM

## 2016-12-03 DIAGNOSIS — R509 Fever, unspecified: Secondary | ICD-10-CM

## 2016-12-03 DIAGNOSIS — Z9049 Acquired absence of other specified parts of digestive tract: Secondary | ICD-10-CM | POA: Insufficient documentation

## 2016-12-03 DIAGNOSIS — J209 Acute bronchitis, unspecified: Secondary | ICD-10-CM | POA: Insufficient documentation

## 2016-12-03 DIAGNOSIS — I959 Hypotension, unspecified: Secondary | ICD-10-CM | POA: Insufficient documentation

## 2016-12-03 DIAGNOSIS — Z833 Family history of diabetes mellitus: Secondary | ICD-10-CM | POA: Insufficient documentation

## 2016-12-03 DIAGNOSIS — J111 Influenza due to unidentified influenza virus with other respiratory manifestations: Principal | ICD-10-CM | POA: Insufficient documentation

## 2016-12-03 DIAGNOSIS — F419 Anxiety disorder, unspecified: Secondary | ICD-10-CM | POA: Insufficient documentation

## 2016-12-03 DIAGNOSIS — F329 Major depressive disorder, single episode, unspecified: Secondary | ICD-10-CM | POA: Insufficient documentation

## 2016-12-03 DIAGNOSIS — A419 Sepsis, unspecified organism: Secondary | ICD-10-CM

## 2016-12-03 HISTORY — DX: Anxiety disorder, unspecified: F41.9

## 2016-12-03 HISTORY — DX: Influenza due to other identified influenza virus with other respiratory manifestations: J10.1

## 2016-12-03 HISTORY — DX: Major depressive disorder, single episode, unspecified: F32.9

## 2016-12-03 HISTORY — DX: Depression, unspecified: F32.A

## 2016-12-03 LAB — URINALYSIS, COMPLETE (UACMP) WITH MICROSCOPIC
BILIRUBIN URINE: NEGATIVE
Bacteria, UA: NONE SEEN
GLUCOSE, UA: NEGATIVE mg/dL
HGB URINE DIPSTICK: NEGATIVE
KETONES UR: NEGATIVE mg/dL
LEUKOCYTES UA: NEGATIVE
NITRITE: NEGATIVE
PH: 5 (ref 5.0–8.0)
PROTEIN: NEGATIVE mg/dL
Specific Gravity, Urine: 1.018 (ref 1.005–1.030)
Squamous Epithelial / LPF: NONE SEEN

## 2016-12-03 LAB — COMPREHENSIVE METABOLIC PANEL
ALK PHOS: 55 U/L (ref 38–126)
ALT: 25 U/L (ref 17–63)
AST: 33 U/L (ref 15–41)
Albumin: 3.7 g/dL (ref 3.5–5.0)
Anion gap: 5 (ref 5–15)
BILIRUBIN TOTAL: 0.5 mg/dL (ref 0.3–1.2)
BUN: 8 mg/dL (ref 6–20)
CALCIUM: 8.4 mg/dL — AB (ref 8.9–10.3)
CO2: 29 mmol/L (ref 22–32)
CREATININE: 1.05 mg/dL (ref 0.61–1.24)
Chloride: 100 mmol/L — ABNORMAL LOW (ref 101–111)
GFR calc Af Amer: >60 mL/min (ref >60)
Glucose, Bld: 96 mg/dL (ref 65–99)
POTASSIUM: 4.2 mmol/L (ref 3.5–5.1)
Sodium: 134 mmol/L — ABNORMAL LOW (ref 135–145)
TOTAL PROTEIN: 7 g/dL (ref 6.5–8.1)

## 2016-12-03 LAB — INFLUENZA PANEL BY PCR (TYPE A & B)
Influenza A By PCR: POSITIVE — AB
Influenza B By PCR: NEGATIVE

## 2016-12-03 LAB — CBC
HCT: 41.5 % (ref 40.0–52.0)
Hemoglobin: 14.6 g/dL (ref 13.0–18.0)
MCH: 31.8 pg (ref 26.0–34.0)
MCHC: 35.2 g/dL (ref 32.0–36.0)
MCV: 90.4 fL (ref 80.0–100.0)
PLATELETS: 180 10*3/uL (ref 150–440)
RBC: 4.59 MIL/uL (ref 4.40–5.90)
RDW: 12.6 % (ref 11.5–14.5)
WBC: 8.5 10*3/uL (ref 3.8–10.6)

## 2016-12-03 LAB — LACTIC ACID, PLASMA: LACTIC ACID, VENOUS: 1.3 mmol/L (ref 0.5–1.9)

## 2016-12-03 MED ORDER — ACETAMINOPHEN 325 MG PO TABS
ORAL_TABLET | ORAL | Status: AC
  Filled 2016-12-03: qty 2

## 2016-12-03 MED ORDER — SODIUM CHLORIDE 0.9 % IV BOLUS (SEPSIS)
1000.0000 mL | Freq: Once | INTRAVENOUS | Status: AC
  Administered 2016-12-03: 1000 mL via INTRAVENOUS

## 2016-12-03 MED ORDER — ALBUTEROL SULFATE (2.5 MG/3ML) 0.083% IN NEBU: 3.0000 mL | INHALATION_SOLUTION | Freq: Four times a day (QID) | RESPIRATORY_TRACT | Status: DC | PRN

## 2016-12-03 MED ORDER — FLUOXETINE HCL 20 MG PO CAPS
40.0000 mg | ORAL_CAPSULE | Freq: Every day | ORAL | Status: DC
  Filled 2016-12-03: qty 2

## 2016-12-03 MED ORDER — OSELTAMIVIR PHOSPHATE 75 MG PO CAPS
75.0000 mg | ORAL_CAPSULE | Freq: Two times a day (BID) | ORAL | Status: DC
Start: 2016-12-03 — End: 2016-12-05
  Administered 2016-12-03 – 2016-12-05 (×4): 75 mg via ORAL
  Filled 2016-12-03 (×4): qty 1

## 2016-12-03 MED ORDER — GUAIFENESIN-CODEINE 100-10 MG/5ML PO SOLN
5.0000 mL | Freq: Four times a day (QID) | ORAL | Status: DC | PRN
  Administered 2016-12-03 – 2016-12-04 (×5): 5 mL via ORAL
  Filled 2016-12-03 (×5): qty 5

## 2016-12-03 MED ORDER — ONDANSETRON HCL 4 MG/2ML IJ SOLN: 4.0000 mg | Freq: Four times a day (QID) | INTRAMUSCULAR | Status: DC | PRN

## 2016-12-03 MED ORDER — BUSPIRONE HCL 10 MG PO TABS
20.0000 mg | ORAL_TABLET | Freq: Two times a day (BID) | ORAL | Status: DC
  Administered 2016-12-03: 20 mg via ORAL
  Filled 2016-12-03 (×3): qty 2

## 2016-12-03 MED ORDER — SODIUM CHLORIDE 0.9 % IV SOLN
INTRAVENOUS | Status: DC
  Administered 2016-12-03 – 2016-12-05 (×4): via INTRAVENOUS

## 2016-12-03 MED ORDER — ACETAMINOPHEN 650 MG RE SUPP: 650.0000 mg | Freq: Four times a day (QID) | RECTAL | Status: DC | PRN

## 2016-12-03 MED ORDER — RISPERIDONE 0.5 MG PO TABS
1.0000 mg | ORAL_TABLET | Freq: Every day | ORAL | Status: DC
  Filled 2016-12-03: qty 2

## 2016-12-03 MED ORDER — ACETAMINOPHEN 325 MG PO TABS
650.0000 mg | ORAL_TABLET | Freq: Four times a day (QID) | ORAL | Status: DC | PRN
  Administered 2016-12-03 (×2): 650 mg via ORAL
  Filled 2016-12-03 (×2): qty 2

## 2016-12-03 MED ORDER — KETOROLAC TROMETHAMINE 30 MG/ML IJ SOLN
30.0000 mg | Freq: Once | INTRAMUSCULAR | Status: AC
  Administered 2016-12-03: 30 mg via INTRAVENOUS

## 2016-12-03 MED ORDER — ONDANSETRON HCL 4 MG PO TABS: 4.0000 mg | ORAL_TABLET | Freq: Four times a day (QID) | ORAL | Status: DC | PRN

## 2016-12-03 MED ORDER — AZITHROMYCIN 250 MG PO TABS
250.0000 mg | ORAL_TABLET | Freq: Every day | ORAL | Status: DC
  Administered 2016-12-03 – 2016-12-05 (×3): 250 mg via ORAL
  Filled 2016-12-03 (×3): qty 1

## 2016-12-03 MED ORDER — OSELTAMIVIR PHOSPHATE 75 MG PO CAPS
75.0000 mg | ORAL_CAPSULE | Freq: Once | ORAL | Status: AC
  Administered 2016-12-03: 75 mg via ORAL

## 2016-12-03 MED ORDER — HEPARIN SODIUM (PORCINE) 5000 UNIT/ML IJ SOLN
5000.0000 [IU] | Freq: Three times a day (TID) | INTRAMUSCULAR | Status: DC
  Administered 2016-12-04: 5000 [IU] via SUBCUTANEOUS
  Filled 2016-12-03 (×3): qty 1

## 2016-12-03 MED ORDER — BISACODYL 10 MG RE SUPP: 10.0000 mg | Freq: Every day | RECTAL | Status: DC | PRN

## 2016-12-03 MED ORDER — ACETAMINOPHEN 325 MG PO TABS
650.0000 mg | ORAL_TABLET | Freq: Once | ORAL | Status: AC | PRN
  Administered 2016-12-03: 650 mg via ORAL

## 2016-12-03 NOTE — ED Notes (Signed)
Syncopal episode this AM, body aches, productive cough and chills.

## 2016-12-03 NOTE — H&P (Signed)
History and Physical    Ruben [name] ZOX:[id number] DOB: 1971/09/27 DOA: 12/03/2016  Referring physician: Dr. Don Perking PCP: No primary care provider on file.  Specialists: none  Chief Complaint: fever and malaise  HPI: Ruben [name] is a 46 y.o. male has a past medical history significant for asthma and depression here with acute fever and malaise with diffuse myalgias. In ER, pt tachycardic and hypotensive despite 2L IV fluids. Remains febrile. He is now admitted. Some diarrhea. No N/V. Denies CP or SOB. Does have slight cough  Review of Systems: The patient denies anorexia, weight loss,, vision loss, decreased hearing, hoarseness, chest pain, syncope, dyspnea on exertion, peripheral edema, balance deficits, hemoptysis, abdominal pain, melena, hematochezia, severe indigestion/heartburn, hematuria, incontinence, genital sores, muscle weakness, suspicious skin lesions, transient blindness, difficulty walking, depression, unusual weight change, abnormal bleeding, enlarged lymph nodes, angioedema, and breast masses.   History reviewed. No pertinent past medical history. Past Surgical History:  Procedure Laterality Date  . CHOLECYSTECTOMY    . TONSILLECTOMY     Social History:  reports that he has been smoking Cigarettes.  He has been smoking about 0.75 packs per day. He has never used smokeless tobacco. He reports that he does not drink alcohol or use drugs.  No Known Allergies  Family History  Problem Relation Age of Onset  . Diabetes Mother   . Diabetes Father     Prior to Admission medications   Medication Sig Start Date End Date Taking? Authorizing Provider  albuterol (PROVENTIL HFA;VENTOLIN HFA) 108 (90 Base) MCG/ACT inhaler Inhale 2 puffs into the lungs every 6 (six) hours as needed for wheezing or shortness of breath. 02/15/16   Chinita Pester, FNP  azithromycin (ZITHROMAX) 250 MG tablet Take 1 tablet (250 mg total) by mouth daily. 12/02/16   Minna Antis, MD   busPIRone (BUSPAR) 10 MG tablet Take 20 mg by mouth 2 (two) times daily.    Historical Provider, MD  FLUoxetine (PROZAC) 40 MG capsule Take 40 mg by mouth at bedtime.    Historical Provider, MD  guaiFENesin-codeine 100-10 MG/5ML syrup Take 5 mLs by mouth every 6 (six) hours as needed for cough. 12/02/16   Minna Antis, MD  ibuprofen (ADVIL,MOTRIN) 600 MG tablet Take 1 tablet (600 mg total) by mouth every 6 (six) hours as needed for moderate pain. 10/06/15   Gayla Doss, MD  oseltamivir (TAMIFLU) 75 MG capsule Take 1 capsule (75 mg total) by mouth 2 (two) times daily. 02/15/16   Chinita Pester, FNP  predniSONE (DELTASONE) 10 MG tablet Take 5 tablets (50 mg total) by mouth daily. 02/15/16   Chinita Pester, FNP  risperiDONE (RISPERDAL) 1 MG tablet Take 1 mg by mouth at bedtime.    Historical Provider, MD   Physical Exam: Vitals:   12/03/16 1030 12/03/16 1053 12/03/16 1130 12/03/16 1200  BP: (!) 95/59  (!) 102/53 99/61  Pulse: (!) 105  (!) 101 90  Resp: (!) 24     Temp:  (!) 100.4 F (38 C)    TempSrc:  Oral    SpO2: 98%  94% 97%  Weight:      Height:         General:  Ill appearing in moderate distress, WDWN, [place]/AT  Eyes: PERRL, EOMI, no scleral icterus, conjunctiva clear  ENT: moist oropharynx without exudate, TM's benign, dentition good  Neck: supple, no lymphadenopathy. No bruits or thyromegaly  Cardiovascular: rapid rate with regular rhythm without MRG; 2+ peripheral  pulses, no JVD, no peripheral edema  Respiratory: CTA biL, good air movement without wheezing, rhonchi or crackled. Respiratory effort normal  Abdomen: soft, non tender to palpation, positive bowel sounds, no guarding, no rebound  Skin: no rashes or lesions  Musculoskeletal: normal bulk and tone, no joint swelling  Psychiatric: normal mood and affect, A&OX3  Neurologic: CN 2-12 grossly intact, Motor strength 5/5 in all 4 groups with symmetric DTR's and non-focal sensory exam  Labs on Admission:  Basic  Metabolic Panel:  Recent Labs Lab 12/02/16 0958 12/03/16 1035  NA 138 134*  K 4.3 4.2  CL 106 100*  CO2 27 29  GLUCOSE 112* 96  BUN 10 8  CREATININE 0.94 1.05  CALCIUM 8.9 8.4*   Liver Function Tests:  Recent Labs Lab 12/03/16 1035  AST 33  ALT 25  ALKPHOS 55  BILITOT 0.5  PROT 7.0  ALBUMIN 3.7   No results for input(s): LIPASE, AMYLASE in the last 168 hours. No results for input(s): AMMONIA in the last 168 hours. CBC:  Recent Labs Lab 12/02/16 0958 12/03/16 1035  WBC 10.9* 8.5  HGB 15.6 14.6  HCT 44.4 41.5  MCV 91.5 90.4  PLT 210 180   Cardiac Enzymes:  Recent Labs Lab 12/02/16 0958  TROPONINI <0.03    BNP (last 3 results) No results for input(s): BNP in the last 8760 hours.  ProBNP (last 3 results) No results for input(s): PROBNP in the last 8760 hours.  CBG: No results for input(s): GLUCAP in the last 168 hours.  Radiological Exams on Admission: Dg Chest 2 View  Result Date: 12/03/2016 CLINICAL DATA:  Shortness of breath. EXAM: CHEST  2 VIEW COMPARISON:  December 02, 2016 FINDINGS: The heart size and mediastinal contours are within normal limits. Both lungs are clear. The visualized skeletal structures are unremarkable. IMPRESSION: No active cardiopulmonary disease. Electronically Signed   By: Gerome Samavid  Williams III M.D   On: 12/03/2016 11:03   Dg Chest 2 View  Result Date: 12/02/2016 CLINICAL DATA:  Chest pain beginning 2 hours ago. Cough and shortness of breath. EXAM: CHEST  2 VIEW COMPARISON:  02/15/2016 FINDINGS: The heart size and mediastinal contours are within normal limits. Both lungs are clear. The visualized skeletal structures are unremarkable. IMPRESSION: No active cardiopulmonary disease. Electronically Signed   By: Paulina FusiMark  Shogry M.D.   On: 12/02/2016 10:21    EKG: Independently reviewed.  Assessment/Plan Principal Problem:   Influenza A Active Problems:   Hypotension   Tachycardia   Fever   Will observe on floor with IV fluids  and Tamiflu. Repeat labs in AM. Monitor vitals closely. No indication for ABX at this point  Diet: clear liquids Fluids: NS@125  DVT Prophylaxis: SQ Heparin  Code Status: FULL  Family Communication: none  Disposition Plan: home  Time spent: 50 min

## 2016-12-03 NOTE — ED Notes (Signed)
Pt given meal tray.

## 2016-12-03 NOTE — ED Triage Notes (Signed)
Pt seen here yesterday and dx with bronchitis. Was seen for cough and SHOB. Pt thinks may have had syncopal episode, has had fevers, and c/o convulsions in middle of night where couldn't stop shaking. Still feels SHOB and with cough per pt. Febrile, tachycardic, and hypotensive compared to vitals yesterday.

## 2016-12-03 NOTE — ED Provider Notes (Signed)
[facility] Emergency Department Provider Note  ____________________________________________  Time seen: Approximately 10:57 AM  I have reviewed the triage vital signs and the nursing notes.   HISTORY  Chief Complaint Fever and Loss of Consciousness   HPI Ruben [name] is a 46 y.o. male the past medical history who presents for evaluation of fever. Patient was seen here yesterday for 3 days of cough productive of white phlegm. He was started on a Z-Pak. This evening he started to have fever, rigors, and generalized body aches. He reports that he woke up this morning he was shaking so bad that he walked in front of the stove, open the door and turned it on for the heat and slept in front of the stove. He is not sure if he passed out in front of the stove. He was found by his teenage son who called the ambulance. Patient denies chest pain or shortness of breath, neck stiffness, headache, rash, abdominal pain, nausea, vomiting, diarrhea. He is a smoker. History of COPD or asthma.  History reviewed. No pertinent past medical history.  There are no active problems to display for this patient.   Past Surgical History:  Procedure Laterality Date  . CHOLECYSTECTOMY    . TONSILLECTOMY      Prior to Admission medications   Medication Sig Start Date End Date Taking? Authorizing Provider  albuterol (PROVENTIL HFA;VENTOLIN HFA) 108 (90 Base) MCG/ACT inhaler Inhale 2 puffs into the lungs every 6 (six) hours as needed for wheezing or shortness of breath. 02/15/16   Chinita Pester, FNP  azithromycin (ZITHROMAX) 250 MG tablet Take 1 tablet (250 mg total) by mouth daily. 12/02/16   Minna Antis, MD  busPIRone (BUSPAR) 10 MG tablet Take 20 mg by mouth 2 (two) times daily.    Historical Provider, MD  FLUoxetine (PROZAC) 40 MG capsule Take 40 mg by mouth at bedtime.    Historical Provider, MD  guaiFENesin-codeine 100-10 MG/5ML syrup Take 5 mLs by mouth every 6 (six)  hours as needed for cough. 12/02/16   Minna Antis, MD  ibuprofen (ADVIL,MOTRIN) 600 MG tablet Take 1 tablet (600 mg total) by mouth every 6 (six) hours as needed for moderate pain. 10/06/15   Gayla Doss, MD  oseltamivir (TAMIFLU) 75 MG capsule Take 1 capsule (75 mg total) by mouth 2 (two) times daily. 02/15/16   Chinita Pester, FNP  predniSONE (DELTASONE) 10 MG tablet Take 5 tablets (50 mg total) by mouth daily. 02/15/16   Chinita Pester, FNP  risperiDONE (RISPERDAL) 1 MG tablet Take 1 mg by mouth at bedtime.    Historical Provider, MD    Allergies Patient has no known allergies.  Family History  Problem Relation Age of Onset  . Diabetes Mother   . Diabetes Father     Social History Social History  Substance Use Topics  . Smoking status: Current Every Day Smoker    Packs/day: 0.75    Types: Cigarettes  . Smokeless tobacco: Never Used  . Alcohol use No     Comment: quit drinking and using illegal drugs in 2013    Review of Systems  Constitutional: + fever, rigors, body aches Eyes: Negative for visual changes. ENT: Negative for sore throat. Neck: No neck pain  Cardiovascular: Negative for chest pain. Respiratory: Negative for shortness of breath. + cough Gastrointestinal: Negative for abdominal pain, vomiting or diarrhea. Genitourinary: Negative for dysuria. Musculoskeletal: Negative for back pain. Skin: Negative for rash. Neurological: Negative  for headaches, weakness or numbness. Psych: No SI or HI  ____________________________________________   PHYSICAL EXAM:  VITAL SIGNS: ED Triage Vitals  Enc Vitals Group     BP 12/03/16 0946 97/63     Pulse Rate 12/03/16 0946 (!) 123     Resp 12/03/16 0946 (!) 28     Temp 12/03/16 0946 (!) 101.3 F (38.5 C)     Temp Source 12/03/16 0946 Oral     SpO2 12/03/16 0946 94 %     Weight 12/03/16 0947 264 lb (119.7 kg)     Height 12/03/16 0947 6\' 2"  (1.88 m)     Head Circumference --      Peak Flow --      Pain Score  12/03/16 0947 8     Pain Loc --      Pain Edu? --      Excl. in GC? --     Constitutional: Alert and oriented. Well appearing and in no apparent distress. HEENT:      Head: Normocephalic and atraumatic.         Eyes: Conjunctivae are normal. Sclera is non-icteric. EOMI. PERRL      Mouth/Throat: Mucous membranes are moist.       Neck: Supple with no signs of meningismus. Cardiovascular: Tachycardic with regular rhythm. No murmurs, gallops, or rubs. 2+ symmetrical distal pulses are present in all extremities. No JVD. Respiratory: Normal respiratory effort. Lungs are clear to auscultation bilaterally. No wheezes, crackles, or rhonchi.  Gastrointestinal: Soft, non tender, and non distended with positive bowel sounds. No rebound or guarding. Genitourinary: No CVA tenderness. Musculoskeletal: Nontender with normal range of motion in all extremities. No edema, cyanosis, or erythema of extremities. Neurologic: Normal speech and language. Face is symmetric. Moving all extremities. No gross focal neurologic deficits are appreciated. Skin: Skin is warm, dry and intact. No rash noted. Psychiatric: Mood and affect are normal. Speech and behavior are normal.  ____________________________________________   LABS (all labs ordered are listed, but only abnormal results are displayed)  Labs Reviewed  URINALYSIS, COMPLETE (UACMP) WITH MICROSCOPIC - Abnormal; Notable for the following:       Result Value   Color, Urine YELLOW (*)    APPearance CLEAR (*)    All other components within normal limits  INFLUENZA PANEL BY PCR (TYPE A & B) - Abnormal; Notable for the following:    Influenza A By PCR POSITIVE (*)    All other components within normal limits  COMPREHENSIVE METABOLIC PANEL - Abnormal; Notable for the following:    Sodium 134 (*)    Chloride 100 (*)    Calcium 8.4 (*)    All other components within normal limits  CULTURE, BLOOD (ROUTINE X 2)  CULTURE, BLOOD (ROUTINE X 2)  URINE CULTURE    LACTIC ACID, PLASMA  CBC  LACTIC ACID, PLASMA   ____________________________________________  EKG  ED ECG REPORT I, Nita Sicklearolina Stefhanie Kachmar, the attending physician, personally viewed and interpreted this ECG.  Sinus tachycardia, rate of 110, normal intervals, normal axis, no ST elevations or depressions. ____________________________________________  RADIOLOGY  CXR:  Negative ____________________________________________   PROCEDURES  Procedure(s) performed: None Procedures Critical Care performed: yes  CRITICAL CARE Performed by: Nita Sicklearolina Karolynn Infantino  ?  Total critical care time: 35 min  Critical care time was exclusive of separately billable procedures and treating other patients.  Critical care was necessary to treat or prevent imminent or life-threatening deterioration.  Critical care was time spent personally by me on the following  activities: development of treatment plan with patient and/or surrogate as well as nursing, discussions with consultants, evaluation of patient's response to treatment, examination of patient, obtaining history from patient or surrogate, ordering and performing treatments and interventions, ordering and review of laboratory studies, ordering and review of radiographic studies, pulse oximetry and re-evaluation of patient's condition.  ____________________________________________   INITIAL IMPRESSION / ASSESSMENT AND PLAN / ED COURSE  46 y.o. male the past medical history who presents for evaluation of fever, rigors, cough, and body aches. Even though patient has had a cough for the last 3 days the fever has started yesterday. Patient currently meet sepsis criteria and sepsis protocol was initiated. Patient is febrile 101.34F, tachycardic to 123 and hypotensive with BP of 97/63. Influenza A positive. CXR with no overlying PNA. No signs or symptoms of meningitis with no neck stiffness, no meningeal signs, no rash, no headache. We'll give IV fluid per  sepsis protocol, by mouth Tylenol, IV Toradol for body aches, tamiflu.    _________________________ 12:03 PM on 12/03/2016 -----------------------------------------  Chest x-ray with no pneumonia, labs within normal limits. We'll admit to the hospitalist.  Pertinent labs & imaging results that were available during my care of the patient were reviewed by me and considered in my medical decision making (see chart for details).    ____________________________________________   FINAL CLINICAL IMPRESSION(S) / ED DIAGNOSES  Final diagnoses:  Sepsis, due to unspecified organism (HCC)  Influenza A      NEW MEDICATIONS STARTED DURING THIS VISIT:  New Prescriptions   No medications on file     Note:  This document was prepared using Dragon voice recognition software and may include unintentional dictation errors.    Nita Sickle, MD 12/03/16 [phone]

## 2016-12-03 NOTE — Progress Notes (Signed)
Pt refused to take buspar, prozac, hepairn, risperdal tonight. He did take tylenol but states he does not like it.

## 2016-12-03 NOTE — ED Notes (Signed)
ED Provider at bedside. 

## 2016-12-03 NOTE — ED Notes (Signed)
Report given to Amanda, RN

## 2016-12-03 NOTE — ED Notes (Signed)
Pt back from x-ray.

## 2016-12-04 LAB — COMPREHENSIVE METABOLIC PANEL
ALBUMIN: 3.1 g/dL — AB (ref 3.5–5.0)
ALK PHOS: 43 U/L (ref 38–126)
ALT: 22 U/L (ref 17–63)
AST: 26 U/L (ref 15–41)
Anion gap: 4 — ABNORMAL LOW (ref 5–15)
BUN: 8 mg/dL (ref 6–20)
CALCIUM: 7.5 mg/dL — AB (ref 8.9–10.3)
CHLORIDE: 110 mmol/L (ref 101–111)
CO2: 24 mmol/L (ref 22–32)
CREATININE: 0.86 mg/dL (ref 0.61–1.24)
GFR calc Af Amer: >60 mL/min (ref >60)
GFR calc non Af Amer: >60 mL/min (ref >60)
GLUCOSE: 129 mg/dL — AB (ref 65–99)
Potassium: 3.8 mmol/L (ref 3.5–5.1)
SODIUM: 138 mmol/L (ref 135–145)
Total Bilirubin: 0.4 mg/dL (ref 0.3–1.2)
Total Protein: 5.6 g/dL — ABNORMAL LOW (ref 6.5–8.1)

## 2016-12-04 LAB — CBC
HCT: 36.3 % — ABNORMAL LOW (ref 40.0–52.0)
Hemoglobin: 12.6 g/dL — ABNORMAL LOW (ref 13.0–18.0)
MCH: 31.9 pg (ref 26.0–34.0)
MCHC: 34.6 g/dL (ref 32.0–36.0)
MCV: 92.2 fL (ref 80.0–100.0)
PLATELETS: 162 10*3/uL (ref 150–440)
RBC: 3.94 MIL/uL — AB (ref 4.40–5.90)
RDW: 13 % (ref 11.5–14.5)
WBC: 6.8 10*3/uL (ref 3.8–10.6)

## 2016-12-04 MED ORDER — OSELTAMIVIR PHOSPHATE 75 MG PO CAPS: 75.0000 mg | ORAL_CAPSULE | Freq: Two times a day (BID) | ORAL | 0 refills | Status: DC

## 2016-12-04 MED ORDER — PREDNISONE 50 MG PO TABS
50.0000 mg | ORAL_TABLET | Freq: Every day | ORAL | Status: DC
  Administered 2016-12-04 – 2016-12-05 (×2): 50 mg via ORAL
  Filled 2016-12-04 (×2): qty 1

## 2016-12-04 MED ORDER — NICOTINE 14 MG/24HR TD PT24
14.0000 mg | MEDICATED_PATCH | Freq: Every day | TRANSDERMAL | Status: DC
  Administered 2016-12-04 – 2016-12-05 (×2): 14 mg via TRANSDERMAL
  Filled 2016-12-04 (×2): qty 1

## 2016-12-04 MED ORDER — IBUPROFEN 400 MG PO TABS
400.0000 mg | ORAL_TABLET | Freq: Four times a day (QID) | ORAL | Status: DC | PRN
  Administered 2016-12-04: 400 mg via ORAL
  Filled 2016-12-04: qty 1

## 2016-12-04 NOTE — Care Management Note (Signed)
Case Management Note  Patient Details  Name: ZYRUS HETLAND MRN: [id number] Date of Birth: [birth year]  Subjective/Objective:                  Met with patient to discuss discharge planning. [facility] is [facility]. He agrees to referral and states his contact number is [phone].   Action/Plan:  Application delivered to patient. [facility] emailed to [facility]. Please provide hard copies of Rx prior to discharge so the [facility] can assist patient with obtaining Rx. [facility] is closed on weekends and after 4PM   Expected Discharge Date:                  Expected Discharge Plan:     In-House Referral:     Discharge planning Services  CM Consult, Medication Assistance, [facility]  Post Acute Care Choice:    Choice offered to:  Patient  DME Arranged:    DME Agency:     HH Arranged:    [place] Agency:     Status of Service:  Completed, signed off  If discussed at [employer], dates discussed:    Additional Comments:  Marshell Garfinkel, RN 12/04/2016, 12:51 PM

## 2016-12-04 NOTE — Progress Notes (Signed)
[employer] - [facility]   PATIENT NAME: Ruben [name]    MR#:  [id number]  DATE OF BIRTH:  07/26/1971  SUBJECTIVE:  CHIEF COMPLAINT:   Chief Complaint  Patient presents with  . Fever  . Loss of Consciousness   Feels fatigued. Febrile. Pain all over with myalgias. Cough.  REVIEW OF SYSTEMS:    Review of Systems  Constitutional: Positive for chills and malaise/fatigue. Negative for fever.  HENT: Negative for sore throat.   Eyes: Negative for blurred vision, double vision and pain.  Respiratory: Positive for cough and shortness of breath. Negative for hemoptysis and wheezing.   Cardiovascular: Negative for chest pain, palpitations, orthopnea and leg swelling.  Gastrointestinal: Negative for abdominal pain, constipation, diarrhea, heartburn, nausea and vomiting.  Genitourinary: Negative for dysuria and hematuria.  Musculoskeletal: Negative for back pain and joint pain.  Skin: Negative for rash.  Neurological: Positive for weakness. Negative for sensory change, speech change, focal weakness and headaches.  Endo/Heme/Allergies: Does not bruise/bleed easily.  Psychiatric/Behavioral: Negative for depression. The patient is not nervous/anxious.     DRUG ALLERGIES:  No Known Allergies  VITALS:  Blood pressure 103/75, pulse 85, temperature 99.4 F (37.4 C), temperature source Oral, resp. rate 18, height 6\' 2"  (1.88 m), weight 122.2 kg (269 lb 6.4 oz), SpO2 96 %.  PHYSICAL EXAMINATION:   Physical Exam  GENERAL:  46 y.o.-year-old patient lying in the bed with no acute distress.  EYES: Pupils equal, round, reactive to light and accommodation. No scleral icterus. Extraocular muscles intact.  HEENT: Head atraumatic, normocephalic. Oropharynx and nasopharynx clear.  NECK:  Supple, no jugular venous distention. No thyroid enlargement, no tenderness.  LUNGS: Decreased air entry on both sides.  CARDIOVASCULAR: S1, S2 normal. No murmurs, rubs, or gallops.   ABDOMEN: Soft, nontender, nondistended. Bowel sounds present. No organomegaly or mass.  EXTREMITIES: No cyanosis, clubbing or edema b/l.    NEUROLOGIC: Cranial nerves II through XII are intact. No focal Motor or sensory deficits b/l.   PSYCHIATRIC: The patient is alert and oriented x 3.  SKIN: No obvious rash, lesion, or ulcer.   LABORATORY PANEL:   CBC  Recent Labs Lab 12/04/16 0348  WBC 6.8  HGB 12.6*  HCT 36.3*  PLT 162   ------------------------------------------------------------------------------------------------------------------ Chemistries   Recent Labs Lab 12/04/16 0348  NA 138  K 3.8  CL 110  CO2 24  GLUCOSE 129*  BUN 8  CREATININE 0.86  CALCIUM 7.5*  AST 26  ALT 22  ALKPHOS 43  BILITOT 0.4   ------------------------------------------------------------------------------------------------------------------  Cardiac Enzymes  Recent Labs Lab 12/02/16 0958  TROPONINI <0.03   ------------------------------------------------------------------------------------------------------------------  RADIOLOGY:  Dg Chest 2 View  Result Date: 12/03/2016 CLINICAL DATA:  Shortness of breath. EXAM: CHEST  2 VIEW COMPARISON:  December 02, 2016 FINDINGS: The heart size and mediastinal contours are within normal limits. Both lungs are clear. The visualized skeletal structures are unremarkable. IMPRESSION: No active cardiopulmonary disease. Electronically Signed   By: Gerome Sam III M.D   On: 12/03/2016 11:03   ASSESSMENT AND PLAN:   * Influenza A with sepsis -IV steroids, Antibiotics - Scheduled Nebulizers - Inhalers - IVF - Consult pulmonary if no improvement  * Anxiety. Continue home medications  Patient continues to be febrile and not feeling well. Likely discharge tomorrow if improved.  All the records are reviewed and case discussed with Care Management/Social Workerr. Management plans discussed with the patient, family and they are in  agreement.  CODE STATUS:  FULL  DVT Prophylaxis: SCDs  TOTAL TIME TAKING CARE OF THIS PATIENT: 35 minutes.   [name], Ruben [name] M.D on 12/04/2016 at 1:17 PM  Between 7am to 6pm - Pager - [phone]  After 6pm go to [email or web address] - password [email or web address] [facility]  [facility] [place] [facility]  Office  [phone]  CC: Primary care physician; No primary care provider on file.  Note: This dictation was prepared with Dragon dictation along with smaller phrase technology. Any transcriptional errors that result from this process are unintentional.

## 2016-12-04 NOTE — Discharge Instructions (Signed)
Resume diet and activity as before ° ° °

## 2016-12-05 LAB — URINE CULTURE: CULTURE: NO GROWTH

## 2016-12-05 MED ORDER — KETOROLAC TROMETHAMINE 10 MG PO TABS: 10.0000 mg | ORAL_TABLET | Freq: Four times a day (QID) | ORAL | 0 refills | Status: DC | PRN

## 2016-12-05 MED ORDER — NICOTINE 14 MG/24HR TD PT24: 14.0000 mg | MEDICATED_PATCH | Freq: Every day | TRANSDERMAL | Status: DC

## 2016-12-05 MED ORDER — INFLUENZA VAC SPLIT QUAD 0.5 ML IM SUSY: 0.5000 mL | PREFILLED_SYRINGE | INTRAMUSCULAR | Status: DC

## 2016-12-05 NOTE — Progress Notes (Signed)
Patient discharge summary reviewed, answered all questions. Returned patient own medication that was stored in hospital RX. Work note printed. VSS at this time.

## 2016-12-05 NOTE — Progress Notes (Signed)
[facility]         [place]   12/05/2016  Patient: Ruben [name]   Date of Birth:  June 02, 1971  Date of admission:  12/03/2016  Date of Discharge  12/05/2016    To Whom it May Concern:   Ruben JunesGerald Malmberg  may return to work on 12/08/2016.   If you have any questions or concerns, please don't hesitate to call.  Sincerely,   [name], Heleena Miceli R M.D Office : [phone]   .

## 2016-12-05 NOTE — Care Management (Signed)
Patient to take any new medications to medication management. I have updated [facility] and [facility] that patient is being discharged today.

## 2016-12-05 NOTE — Care Management (Signed)
Received call from floor stating that patient has returned for assistance with Tamiflu and Toradol. MATCH assistance provided via fax to Ortho to give to patient that is waiting at nurses station. No further RNCM needs.

## 2016-12-07 NOTE — Discharge Summary (Signed)
[employer] - [place] [facility]   PATIENT NAME: Ruben JunesGerald Leichter    MR#:  [id number]  DATE OF BIRTH:  [birth year]  DATE OF ADMISSION:  12/03/2016 ADMITTING PHYSICIAN: Marguarite ArbourJeffrey D Sparks, MD  DATE OF DISCHARGE: 12/05/2016 11:46 AM  PRIMARY CARE PHYSICIAN: No primary care provider on file.   ADMISSION DIAGNOSIS:  Influenza A [J10.1] Sepsis, due to unspecified organism (HCC) [A41.9]  DISCHARGE DIAGNOSIS:  Principal Problem:   Influenza A Active Problems:   Hypotension   Tachycardia   Fever   SECONDARY DIAGNOSIS:   Past Medical History:  Diagnosis Date  . Anxiety   . Depression   . Influenza A      ADMITTING HISTORY  Chief Complaint: fever and malaise  HPI: Romelle StarcherGerald D Tomasik II is a 46 y.o. male has a past medical history significant for asthma and depression here with acute fever and malaise with diffuse myalgias. In ER, pt tachycardic and hypotensive despite 2L IV fluids. Remains febrile. He is now admitted. Some diarrhea. No N/V. Denies CP or SOB. Does have slight cough  HOSPITAL COURSE:   * influenza A * acute bronchitis * sepsis present on admission  She was admitted onto medical floor. Aggressively treated with IV fluids. Started on Tamiflu, steroids and breathing treatments. By the day of discharge patient with sepsis has resolved. Afebrile. On room air. Ambulatednormally. He will be discharged home on Tamiflu, prednisone.   CONSULTS OBTAINED:    DRUG ALLERGIES:  No Known Allergies  DISCHARGE MEDICATIONS:   Discharge Medication List as of 12/05/2016 11:01 AM    START taking these medications   Details  ketorolac (TORADOL) 10 MG tablet Take 1 tablet (10 mg total) by mouth every 6 (six) hours as needed for severe pain., Starting Tue 12/05/2016, Normal    oseltamivir (TAMIFLU) 75 MG capsule Take 1 capsule (75 mg total) by mouth 2 (two) times daily., Starting Mon 12/04/2016, Normal      CONTINUE these medications which have NOT CHANGED    Details  azithromycin (ZITHROMAX) 250 MG tablet Take 1 tablet (250 mg total) by mouth daily., Starting Sat 12/02/2016, Print    guaiFENesin-codeine 100-10 MG/5ML syrup Take 5 mLs by mouth every 6 (six) hours as needed for cough., Starting Sat 12/02/2016, Print    albuterol (PROVENTIL HFA;VENTOLIN HFA) 108 (90 Base) MCG/ACT inhaler Inhale 2 puffs into the lungs every 6 (six) hours as needed for wheezing or shortness of breath., Starting Tue 02/15/2016, Print      STOP taking these medications     busPIRone (BUSPAR) 10 MG tablet      FLUoxetine (PROZAC) 40 MG capsule      ibuprofen (ADVIL,MOTRIN) 600 MG tablet      risperiDONE (RISPERDAL) 1 MG tablet         Today   VITAL SIGNS:  Blood pressure 122/89, pulse 69, temperature 97.5 F (36.4 C), temperature source Oral, resp. rate 20, height 6\' 2"  (1.88 m), weight 121.7 kg (268 lb 4.8 oz), SpO2 97 %.  I/O:  No intake or output data in the 24 hours ending 12/07/16 1516  PHYSICAL EXAMINATION:  Physical Exam  GENERAL:  46 y.o.-year-old patient lying in the bed with no acute distress.  LUNGS: Normal breath sounds bilaterally, no wheezing, rales,rhonchi or crepitation. No use of accessory muscles of respiration.  CARDIOVASCULAR: S1, S2 normal. No murmurs, rubs, or gallops.  ABDOMEN: Soft, non-tender, non-distended. Bowel sounds present. No organomegaly or mass.  NEUROLOGIC: Moves all 4 extremities. PSYCHIATRIC: The  patient is alert and oriented x 3.  SKIN: No obvious rash, lesion, or ulcer.   DATA REVIEW:   CBC  Recent Labs Lab 12/04/16 0348  WBC 6.8  HGB 12.6*  HCT 36.3*  PLT 162    Chemistries   Recent Labs Lab 12/04/16 0348  NA 138  K 3.8  CL 110  CO2 24  GLUCOSE 129*  BUN 8  CREATININE 0.86  CALCIUM 7.5*  AST 26  ALT 22  ALKPHOS 43  BILITOT 0.4    Cardiac Enzymes  Recent Labs Lab 12/02/16 0958  TROPONINI <0.03    Microbiology Results  Results for orders placed or performed during the hospital  encounter of 12/03/16  Urine culture     Status: None   Collection Time: 12/03/16  9:51 AM  Result Value Ref Range Status   Specimen Description URINE, RANDOM  Final   Special Requests NONE  Final   Culture   Final    NO GROWTH Performed at [facility], [address] [place], [place] [address]    Report Status 12/05/2016 FINAL  Final  Blood Culture (routine x 2)     Status: None (Preliminary result)   Collection Time: 12/03/16 10:35 AM  Result Value Ref Range Status   Specimen Description BLOOD RIGHT ASSIST CONTROL  Final   Special Requests BOTTLES DRAWN AEROBIC AND ANAEROBIC  8CC  Final   Culture NO GROWTH 4 DAYS  Final   Report Status PENDING  Incomplete  Blood Culture (routine x 2)     Status: None (Preliminary result)   Collection Time: 12/03/16 10:40 AM  Result Value Ref Range Status   Specimen Description BLOOD RIGHT HAND  Final   Special Requests   Final    BOTTLES DRAWN AEROBIC AND ANAEROBIC  AEROBIC 12CC, ANAEROBIC 15CC   Culture NO GROWTH 4 DAYS  Final   Report Status PENDING  Incomplete    RADIOLOGY:  No results found.  Follow up with PCP in 1 week.  Management plans discussed with the patient, family and they are in agreement.  CODE STATUS:  Code Status History    Date Active Date Inactive Code Status Order ID Comments User Context   12/03/2016  2:30 PM 12/05/2016  2:46 PM Full Code [id number]  Marguarite Arbour, MD Inpatient      TOTAL TIME TAKING CARE OF THIS PATIENT ON DAY OF DISCHARGE: more than 30 minutes.   Milagros Loll R M.D on 12/07/2016 at 3:16 PM  Between 7am to 6pm - Pager - [phone]  After 6pm go to [email or web address] - password [email or web address] [facility]  [facility] [place] [facility]  Office  [phone]  CC: Primary care physician; No primary care provider on file.  Note: This dictation was prepared with Dragon dictation along with smaller phrase technology. Any transcriptional errors that result from this process are unintentional.

## 2016-12-08 LAB — CULTURE, BLOOD (ROUTINE X 2)
Culture: NO GROWTH
Culture: NO GROWTH

## 2017-01-15 ENCOUNTER — Emergency Department: Payer: Self-pay

## 2017-01-15 ENCOUNTER — Encounter: Payer: Self-pay | Admitting: Emergency Medicine

## 2017-01-15 ENCOUNTER — Emergency Department
Admission: EM | Admit: 2017-01-15 | Discharge: 2017-01-15 | Disposition: A | Discharge status: Home / Self Care | Payer: Self-pay | Attending: Emergency Medicine | Admitting: Emergency Medicine

## 2017-01-15 DIAGNOSIS — I8390 Asymptomatic varicose veins of unspecified lower extremity: Secondary | ICD-10-CM

## 2017-01-15 DIAGNOSIS — Z79899 Other long term (current) drug therapy: Secondary | ICD-10-CM | POA: Insufficient documentation

## 2017-01-15 DIAGNOSIS — I82812 Embolism and thrombosis of superficial veins of left lower extremities: Secondary | ICD-10-CM | POA: Insufficient documentation

## 2017-01-15 DIAGNOSIS — F17210 Nicotine dependence, cigarettes, uncomplicated: Secondary | ICD-10-CM | POA: Insufficient documentation

## 2017-01-15 DIAGNOSIS — I8290 Acute embolism and thrombosis of unspecified vein: Secondary | ICD-10-CM

## 2017-01-15 DIAGNOSIS — E876 Hypokalemia: Secondary | ICD-10-CM | POA: Insufficient documentation

## 2017-01-15 LAB — CBC
HEMATOCRIT: 38.8 % — AB (ref 40.0–52.0)
Hemoglobin: 14 g/dL (ref 13.0–18.0)
MCH: 32.8 pg (ref 26.0–34.0)
MCHC: 36 g/dL (ref 32.0–36.0)
MCV: 91.2 fL (ref 80.0–100.0)
PLATELETS: 204 10*3/uL (ref 150–440)
RBC: 4.26 MIL/uL — ABNORMAL LOW (ref 4.40–5.90)
RDW: 13.2 % (ref 11.5–14.5)
WBC: 9.8 10*3/uL (ref 3.8–10.6)

## 2017-01-15 LAB — BASIC METABOLIC PANEL
Anion gap: 6 (ref 5–15)
BUN: 8 mg/dL (ref 6–20)
CALCIUM: 8.3 mg/dL — AB (ref 8.9–10.3)
CO2: 26 mmol/L (ref 22–32)
CREATININE: 0.87 mg/dL (ref 0.61–1.24)
Chloride: 107 mmol/L (ref 101–111)
GFR calc Af Amer: >60 mL/min (ref >60)
GLUCOSE: 107 mg/dL — AB (ref 65–99)
Potassium: 3.2 mmol/L — ABNORMAL LOW (ref 3.5–5.1)
Sodium: 139 mmol/L (ref 135–145)

## 2017-01-15 MED ORDER — KETOROLAC TROMETHAMINE 10 MG PO TABS
10.0000 mg | ORAL_TABLET | Freq: Once | ORAL | Status: AC
  Administered 2017-01-15: 10 mg via ORAL

## 2017-01-15 MED ORDER — POTASSIUM CHLORIDE CRYS ER 20 MEQ PO TBCR
40.0000 meq | EXTENDED_RELEASE_TABLET | Freq: Once | ORAL | Status: AC
  Administered 2017-01-15: 40 meq via ORAL
  Filled 2017-01-15: qty 2

## 2017-01-15 MED ORDER — KETOROLAC TROMETHAMINE 10 MG PO TABS
ORAL_TABLET | ORAL | Status: AC
  Filled 2017-01-15: qty 1

## 2017-01-15 MED ORDER — IBUPROFEN 600 MG PO TABS: 600.0000 mg | ORAL_TABLET | Freq: Three times a day (TID) | ORAL | 0 refills | Status: DC

## 2017-01-15 MED ORDER — ACETAMINOPHEN 500 MG PO TABS
1000.0000 mg | ORAL_TABLET | Freq: Once | ORAL | Status: AC
  Administered 2017-01-15: 1000 mg via ORAL
  Filled 2017-01-15: qty 2

## 2017-01-15 NOTE — ED Triage Notes (Signed)
C/O left ankle swelling.  States has chronic history of ankle swelling, but states last night swelling became worse and is painful to touch.

## 2017-01-15 NOTE — Discharge Instructions (Signed)
Please have your primary care physician recheck your potassium level. Please also ask your primary care physician to recommend a vein clinic where you can have your varicose veins evaluated.  You may alternate Tylenol and Motrin for pain, but make sure you are not taking more than the recommended dosages. Please wear TED hose to prevent swelling, and elevate your left leg above the level of your heart as much as possible when you're not at work.  Return to the emergency department if you develop severe pain, chest pain or shortness of breath, fever, or any other symptoms concerning to you.

## 2017-01-15 NOTE — ED Provider Notes (Signed)
[facility] Emergency Department Provider Note  ____________________________________________  Time seen: Approximately 8:16 AM  I have reviewed the triage vital signs and the nursing notes.   HISTORY  Chief Complaint Joint Swelling    HPI Ruben [name] is a 46 y.o. male with a history of depression and anxiety, recurrent left lower extremity swelling of unknown etiology presenting with left lower extremity swelling. The patient was working overnight, standing on his feet, when he noticed a progressively worsening swelling in his left foot and leg below the knee. This was not particularly unusual, but he did bump the leg lightly on something, and noticed significant pain. He states he has been diagnosed with phlebitis in the left lower extremity in the past. He is not having chest pain or shortness of breath and has no personal or family history of blood clots. No fever, chills, nausea or vomiting. No trauma. No numbness tingling or weakness.   Past Medical History:  Diagnosis Date  . Anxiety   . Depression   . Influenza A     Patient Active Problem List   Diagnosis Date Noted  . Influenza A 12/03/2016  . Hypotension 12/03/2016  . Tachycardia 12/03/2016  . Fever 12/03/2016    Past Surgical History:  Procedure Laterality Date  . CHOLECYSTECTOMY    . TONSILLECTOMY      Current Outpatient Rx  . Order #: [id number] Class: Print  . Order #: [id number] Class: Print  . Order #: [id number] Class: Print  . Order #: [id number] Class: Print  . Order #: [id number] Class: Normal  . Order #: [id number] Class: Normal    Allergies Patient has no known allergies.  Family History  Problem Relation Age of Onset  . Diabetes Mother   . Diabetes Father     Social History Social History  Substance Use Topics  . Smoking status: Current Every Day Smoker    Packs/day: 0.75    Types: Cigarettes  . Smokeless tobacco: Never Used  . Alcohol use No     Comment: quit  drinking and using illegal drugs in 2013    Review of Systems Constitutional: No fever/chills. Eyes: No visual changes. ENT: No congestion or rhinorrhea. Cardiovascular: Denies chest pain. Denies palpitations. Respiratory: Denies shortness of breath.  No cough. Gastrointestinal:No nausea, no vomiting.  No diarrhea.  No constipation. Musculoskeletal: Positive for left lower 70 swelling. Skin: Negative for rash. Neurological: Negative for headaches. No focal numbness, tingling or weakness. No difficulty walking.  10-point ROS otherwise negative.  ____________________________________________   PHYSICAL EXAM:  VITAL SIGNS: ED Triage Vitals  Enc Vitals Group     BP 01/15/17 0741 133/71     Pulse Rate 01/15/17 0741 83     Resp 01/15/17 0741 18     Temp 01/15/17 0741 98 F (36.7 C)     Temp Source 01/15/17 0741 Oral     SpO2 01/15/17 0741 100 %     Weight 01/15/17 0741 264 lb (119.7 kg)     Height 01/15/17 0741 6\' 2"  (1.88 m)     Head Circumference --      Peak Flow --      Pain Score 01/15/17 0743 9     Pain Loc --      Pain Edu? --      Excl. in GC? --     Constitutional: Alert and oriented. Playing on his phone during the entirety are discussion, resting comfortably. Answers questions appropriately. Eyes: Conjunctivae are normal.  EOMI. No scleral icterus. Head:  Atraumatic. Nose: No congestion/rhinnorhea. Mouth/Throat: Mucous membranes are moist.  Neck: No stridor.  Supple.  No meningismus. Cardiovascular: Normal rate, regular rhythm. No murmurs, rubs or gallops.  Respiratory: Normal respiratory effort.  No accessory muscle use or retractions. Lungs CTAB.  No wheezes, rales or ronchi. Musculoskeletal: Positive left lower extremity edema from the mid foot to the proximal tibia with diffuse tenderness to palpation particularly over the medial malleolus. The left lower extremity is warm but there is no erythema. Normal DP and PT pulses bilaterally. Normal sensation to light  touch bilaterally. 5 out of 5 dorsiflexion and plantar flexion bilaterally. Neurologic:  A&Ox3.  Speech is clear.  Face and smile are symmetric.  EOMI.  Moves all extremities well. Skin:  Skin is warm, dry and intact. No rash noted. Psychiatric: Normal mood with flat affect.   ____________________________________________   LABS (all labs ordered are listed, but only abnormal results are displayed)  Labs Reviewed  CBC - Abnormal; Notable for the following:       Result Value   RBC 4.26 (*)    HCT 38.8 (*)    All other components within normal limits  BASIC METABOLIC PANEL - Abnormal; Notable for the following:    Potassium 3.2 (*)    Glucose, Bld 107 (*)    Calcium 8.3 (*)    All other components within normal limits   ____________________________________________  EKG  Not indicated ____________________________________________  RADIOLOGY  US Venous Img Lower Unilateral Left  Result Date: 01/15/2017 CLINICAL DATA:  Left leg swelling EXAM: Left LOWER EXTREMITY VENOUS DOPPLER ULTRASOUND TECHNIQUE: Gray-scale sonography with graded compression, as well as color Doppler and duplex ultrasound were performed to evaluate the lower extremity deep venous systems from the level of the common femoral vein and including the common femoral, femoral, profunda femoral, popliteal and calf veins including the posterior tibial, peroneal and gastrocnemius veins when visible. The superficial great saphenous vein was also interrogated. Spectral Doppler was utilized to evaluate flow at rest and with distal augmentation maneuvers in the common femoral, femoral and popliteal veins. COMPARISON:  Duplex 06/08/2012 FINDINGS: Contralateral Common Femoral Vein: Respiratory phasicity is normal and symmetric with the symptomatic side. No evidence of thrombus. Normal compressibility. Common Femoral Vein: No evidence of thrombus. Normal compressibility, respiratory phasicity and response to augmentation. Saphenofemoral  Junction: No evidence of thrombus. Normal compressibility and flow on color Doppler imaging. Profunda Femoral Vein: No evidence of thrombus. Normal compressibility and flow on color Doppler imaging. Femoral Vein: No evidence of thrombus. Normal compressibility, respiratory phasicity and response to augmentation. Popliteal Vein: No evidence of thrombus. Normal compressibility, respiratory phasicity and response to augmentation. Calf Veins: No evidence of thrombus. Normal compressibility and flow on color Doppler imaging. Superficial Great Saphenous Vein: No evidence of thrombus. Normal compressibility and flow on color Doppler imaging. Venous Reflux:  None. Other Findings: Varicosities in the medial left calf are relatively large. Nonocclusive thrombus in the varicose veins. IMPRESSION: No evidence of deep venous thrombosis. Varicose veins containing some nonocclusive thrombus. Electronically Signed   By: Marlan Palau M.D.   On: 01/15/2017 08:54    ____________________________________________   PROCEDURES  Procedure(s) performed: None  Procedures  Critical Care performed: No ____________________________________________   INITIAL IMPRESSION / ASSESSMENT AND PLAN / ED COURSE  Pertinent labs & imaging results that were available during my care of the patient were reviewed by me and considered in my medical decision making (see chart for details).  46 y.o. male with a history of intermittent  left lower extremity swelling presenting with swelling and tenderness. On my examination he does have some mild warmth but no obvious erythema. We'll get an ultrasound to rule out DVT. I will also get some basic labs to evaluate his creatinine although unilateral edema would be unlikely from renal dysfunction. I do not think he has congestive heart failure, nor a fracture in the left lower extremity.  He may have some venous insufficiency. I'll treat his pain, and reevaluate him for final disposition. If his  workup in the emergency department is negative, I will initiate antibiotics given the possibility of a cellulitis due to the warmth on my examination.  ----------------------------------------- 10:50 AM on 01/15/2017 -----------------------------------------  The patient is mild hypokalemic and has been supplemented for this. He will follow-up with his primary care physician to have this rechecked.  The patient has varicosities with thrombus on ultrasound but no DVT. At this time, he has not undergone a trial of compression with NSAID treatment. Given the risk-benefit Association of anticoagulation, anticoagulants are not indicated at this time. I have given him instructions for compression with NSAID use, and close PMD follow-up.  He understands return precautions as well as follow up instructions.  ____________________________________________  FINAL CLINICAL IMPRESSION(S) / ED DIAGNOSES  Final diagnoses:  Hypokalemia  Varicose vein of leg  Thrombus         NEW MEDICATIONS STARTED DURING THIS VISIT:  New Prescriptions   IBUPROFEN (ADVIL,MOTRIN) 600 MG TABLET    Take 1 tablet (600 mg total) by mouth 3 (three) times daily. With food      Rockne Menghini, MD 01/15/17 1051

## 2017-01-15 NOTE — ED Notes (Signed)
See triage note  States he developed swelling to left ankle and lower leg last pm positive swelling noted good pulses but tender on palpation  Denies any injury but has had similar episodes in past

## 2017-01-23 ENCOUNTER — Emergency Department: Payer: Managed Care, Other (non HMO)

## 2017-01-23 ENCOUNTER — Emergency Department
Admission: EM | Admit: 2017-01-23 | Discharge: 2017-01-23 | Disposition: A | Discharge status: Home / Self Care | Payer: Managed Care, Other (non HMO) | Attending: Emergency Medicine | Admitting: Emergency Medicine

## 2017-01-23 DIAGNOSIS — M25532 Pain in left wrist: Secondary | ICD-10-CM

## 2017-01-23 DIAGNOSIS — Y929 Unspecified place or not applicable: Secondary | ICD-10-CM | POA: Insufficient documentation

## 2017-01-23 DIAGNOSIS — Y999 Unspecified external cause status: Secondary | ICD-10-CM | POA: Insufficient documentation

## 2017-01-23 DIAGNOSIS — F17210 Nicotine dependence, cigarettes, uncomplicated: Secondary | ICD-10-CM | POA: Insufficient documentation

## 2017-01-23 DIAGNOSIS — Z79899 Other long term (current) drug therapy: Secondary | ICD-10-CM | POA: Insufficient documentation

## 2017-01-23 DIAGNOSIS — W000XXA Fall on same level due to ice and snow, initial encounter: Secondary | ICD-10-CM | POA: Insufficient documentation

## 2017-01-23 DIAGNOSIS — Y939 Activity, unspecified: Secondary | ICD-10-CM | POA: Insufficient documentation

## 2017-01-23 MED ORDER — OXYCODONE-ACETAMINOPHEN 5-325 MG PO TABS: 1.0000 | ORAL_TABLET | Freq: Four times a day (QID) | ORAL | 0 refills | Status: DC | PRN

## 2017-01-23 MED ORDER — ONDANSETRON 4 MG PO TBDP
4.0000 mg | ORAL_TABLET | Freq: Once | ORAL | Status: AC
  Administered 2017-01-23: 4 mg via ORAL
  Filled 2017-01-23: qty 1

## 2017-01-23 MED ORDER — OXYCODONE-ACETAMINOPHEN 5-325 MG PO TABS
1.0000 | ORAL_TABLET | Freq: Once | ORAL | Status: AC
  Administered 2017-01-23: 1 via ORAL
  Filled 2017-01-23: qty 1

## 2017-01-23 NOTE — ED Triage Notes (Signed)
Pt fell on the ice and has left wrist pain, no deformity noted.

## 2017-01-23 NOTE — ED Notes (Addendum)
Velcro lt wrist splint applied per MD request.

## 2017-01-23 NOTE — ED Provider Notes (Signed)
[facility] Emergency Department Provider Note    First MD Initiated Contact with Patient 01/23/17 0109     (approximate)  I have reviewed the triage vital signs and the nursing notes.   HISTORY  Chief Complaint Wrist Pain   HPI Ruben [name] is a 46 y.o. male presents to the emergency department status post accidental slip and fall on ice with left wrist injury. Patient states his current pain score is 10 out of 10. Patient states pain is worse with movement denies any alleviating factors. Patient denies any head injury   Past Medical History:  Diagnosis Date  . Anxiety   . Depression   . Influenza A     Patient Active Problem List   Diagnosis Date Noted  . Influenza A 12/03/2016  . Hypotension 12/03/2016  . Tachycardia 12/03/2016  . Fever 12/03/2016    Past Surgical History:  Procedure Laterality Date  . CHOLECYSTECTOMY    . TONSILLECTOMY      Prior to Admission medications   Medication Sig Start Date End Date Taking? Authorizing Provider  albuterol (PROVENTIL HFA;VENTOLIN HFA) 108 (90 Base) MCG/ACT inhaler Inhale 2 puffs into the lungs every 6 (six) hours as needed for wheezing or shortness of breath. Patient not taking: Reported on 12/03/2016 02/15/16   Chinita Pester, FNP  azithromycin (ZITHROMAX) 250 MG tablet Take 1 tablet (250 mg total) by mouth daily. 12/02/16   Minna Antis, MD  guaiFENesin-codeine 100-10 MG/5ML syrup Take 5 mLs by mouth every 6 (six) hours as needed for cough. 12/02/16   Minna Antis, MD  ibuprofen (ADVIL,MOTRIN) 600 MG tablet Take 1 tablet (600 mg total) by mouth 3 (three) times daily. With food 01/15/17   Rockne Menghini, MD  ketorolac (TORADOL) 10 MG tablet Take 1 tablet (10 mg total) by mouth every 6 (six) hours as needed for severe pain. 12/05/16   Milagros Loll, MD  oseltamivir (TAMIFLU) 75 MG capsule Take 1 capsule (75 mg total) by mouth 2 (two) times daily. 12/04/16   Milagros Loll, MD     Allergies No known drug allergies   Family History  Problem Relation Age of Onset  . Diabetes Mother   . Diabetes Father     Social History Social History  Substance Use Topics  . Smoking status: Current Every Day Smoker    Packs/day: 0.75    Types: Cigarettes  . Smokeless tobacco: Never Used  . Alcohol use No     Comment: quit drinking and using illegal drugs in 2013    Review of Systems Constitutional: No fever/chills Eyes: No visual changes. ENT: No sore throat. Cardiovascular: Denies chest pain. Respiratory: Denies shortness of breath. Gastrointestinal: No abdominal pain.  No nausea, no vomiting.  No diarrhea.  No constipation. Genitourinary: Negative for dysuria. Musculoskeletal: Negative for back pain.Positive for left wrist pain Skin: Negative for rash. Neurological: Negative for headaches, focal weakness or numbness.  10-point ROS otherwise negative.  ____________________________________________   PHYSICAL EXAM:  VITAL SIGNS: ED Triage Vitals  Enc Vitals Group     BP 01/23/17 0059 (!) 136/91     Pulse Rate 01/23/17 0059 80     Resp 01/23/17 0059 18     Temp 01/23/17 0059 98.7 F (37.1 C)     Temp Source 01/23/17 0059 Oral     SpO2 01/23/17 0059 100 %     Weight 01/23/17 0059 264 lb (119.7 kg)     Height 01/23/17 0059 6\' 2"  (1.88 m)  Head Circumference --      Peak Flow --      Pain Score 01/23/17 0100 10     Pain Loc --      Pain Edu? --      Excl. in GC? --     Constitutional: Alert and oriented. Well appearing and in no acute distress. Eyes: Conjunctivae are normal. PERRL. EOMI. Head: Atraumatic. Ears:  Healthy appearing ear canals and TMs bilaterally Nose: No congestion/rhinnorhea. Mouth/Throat: Mucous membranes are moist.  Oropharynx non-erythematous. Neck: No stridor.   Musculoskeletal: No lower extremity tenderness nor edema. No gross deformities of extremities. Left wrist pain with palpation Refill less than 2 seconds   Neurologic:  Normal speech and language. No gross focal neurologic deficits are appreciated.  Skin:  Skin is warm, dry and intact. No rash noted. Psychiatric: Mood and affect are normal. Speech and behavior are normal.**}   RADIOLOGY I, [place] N BROWN, personally viewed and evaluated these images (plain radiographs) as part of my medical decision making, as well as reviewing the written report by the radiologist.  Dg Wrist Complete Left  Result Date: 01/23/2017 CLINICAL DATA:  Left wrist pain after fall on ice. EXAM: LEFT WRIST - COMPLETE 3+ VIEW COMPARISON:  None. FINDINGS: There is no evidence of fracture or dislocation. There is no evidence of arthropathy or other focal bone abnormality. Soft tissues are unremarkable. IMPRESSION: Negative. Electronically Signed   By: Burman NievesWilliam  Stevens M.D.   On: 01/23/2017 01:18     Procedures     INITIAL IMPRESSION / ASSESSMENT AND PLAN / ED COURSE  Pertinent labs & imaging results that were available during my care of the patient were reviewed by me and considered in my medical decision making (see chart for details).  46 year old male presenting with left wrist pain status post accidental slip and fall on ice. Patient's x-ray revealed no evidence of fracture as per the radiologist. Patient is advised to follow-up with orthopedic surgeon if pain is not progressively improve. Left Velcro wrist splint applied.      ____________________________________________  FINAL CLINICAL IMPRESSION(S) / ED DIAGNOSES  Final diagnoses:  Acute pain of left wrist     MEDICATIONS GIVEN DURING THIS VISIT:  Medications  ondansetron (ZOFRAN-ODT) disintegrating tablet 4 mg (4 mg Oral Given 01/23/17 0123)  oxyCODONE-acetaminophen (PERCOCET/ROXICET) 5-325 MG per tablet 1 tablet (1 tablet Oral Given 01/23/17 0123)     NEW OUTPATIENT MEDICATIONS STARTED DURING THIS VISIT:  New Prescriptions   No medications on file    Modified Medications   No  medications on file    Discontinued Medications   No medications on file     Note:  This document was prepared using Dragon voice recognition software and may include unintentional dictation errors.    Darci Currentandolph N Brown, MD 01/23/17 [phone]

## 2017-06-19 ENCOUNTER — Encounter: Payer: Self-pay | Admitting: *Deleted

## 2017-06-19 ENCOUNTER — Emergency Department
Admission: EM | Admit: 2017-06-19 | Discharge: 2017-06-19 | Disposition: A | Discharge status: Home / Self Care | Payer: Managed Care, Other (non HMO) | Attending: Emergency Medicine | Admitting: Emergency Medicine

## 2017-06-19 ENCOUNTER — Emergency Department: Payer: Managed Care, Other (non HMO)

## 2017-06-19 DIAGNOSIS — Y939 Activity, unspecified: Secondary | ICD-10-CM | POA: Insufficient documentation

## 2017-06-19 DIAGNOSIS — W1789XA Other fall from one level to another, initial encounter: Secondary | ICD-10-CM | POA: Insufficient documentation

## 2017-06-19 DIAGNOSIS — S52501A Unspecified fracture of the lower end of right radius, initial encounter for closed fracture: Secondary | ICD-10-CM

## 2017-06-19 DIAGNOSIS — Y999 Unspecified external cause status: Secondary | ICD-10-CM | POA: Insufficient documentation

## 2017-06-19 DIAGNOSIS — S52591A Other fractures of lower end of right radius, initial encounter for closed fracture: Secondary | ICD-10-CM | POA: Insufficient documentation

## 2017-06-19 DIAGNOSIS — Y929 Unspecified place or not applicable: Secondary | ICD-10-CM | POA: Insufficient documentation

## 2017-06-19 MED ORDER — HYDROCODONE-ACETAMINOPHEN 5-325 MG PO TABS
1.0000 | ORAL_TABLET | Freq: Once | ORAL | Status: AC
  Administered 2017-06-19: 1 via ORAL

## 2017-06-19 MED ORDER — HYDROCODONE-ACETAMINOPHEN 7.5-325 MG/15ML PO SOLN: 10.0000 mL | Freq: Once | ORAL | Status: DC

## 2017-06-19 MED ORDER — HYDROCODONE-ACETAMINOPHEN 5-325 MG PO TABS
ORAL_TABLET | ORAL | Status: AC
  Administered 2017-06-19: 1 via ORAL
  Filled 2017-06-19: qty 1

## 2017-06-19 MED ORDER — HYDROCODONE-ACETAMINOPHEN 5-325 MG PO TABS: 1.0000 | ORAL_TABLET | ORAL | 0 refills | Status: AC | PRN

## 2017-06-19 MED ORDER — HYDROCODONE-ACETAMINOPHEN 5-325 MG PO TABS: 1.0000 | ORAL_TABLET | ORAL | 0 refills | Status: DC | PRN

## 2017-06-19 NOTE — Discharge Instructions (Signed)
Call in the morning to schedule an appointment with Dr. Hyacinth MeekerMiller. Return to the ER for symptoms of concern if unable to see him or primary care.

## 2017-06-19 NOTE — ED Triage Notes (Signed)
Pt fell off back of truck and fell onto right wrist.  Truck was moving in a parking lot.  Pt alert.

## 2017-06-22 ENCOUNTER — Encounter: Payer: Self-pay | Admitting: Emergency Medicine

## 2017-06-22 ENCOUNTER — Emergency Department
Admission: EM | Admit: 2017-06-22 | Discharge: 2017-06-22 | Disposition: A | Discharge status: Home / Self Care | Payer: Self-pay | Attending: Emergency Medicine | Admitting: Emergency Medicine

## 2017-06-22 ENCOUNTER — Emergency Department: Payer: Self-pay

## 2017-06-22 DIAGNOSIS — L02215 Cutaneous abscess of perineum: Secondary | ICD-10-CM | POA: Insufficient documentation

## 2017-06-22 DIAGNOSIS — F17210 Nicotine dependence, cigarettes, uncomplicated: Secondary | ICD-10-CM | POA: Insufficient documentation

## 2017-06-22 DIAGNOSIS — Z79899 Other long term (current) drug therapy: Secondary | ICD-10-CM | POA: Insufficient documentation

## 2017-06-22 LAB — URINALYSIS, COMPLETE (UACMP) WITH MICROSCOPIC
BACTERIA UA: NONE SEEN
Bilirubin Urine: NEGATIVE
Glucose, UA: NEGATIVE mg/dL
Hgb urine dipstick: NEGATIVE
Ketones, ur: NEGATIVE mg/dL
Leukocytes, UA: NEGATIVE
Nitrite: NEGATIVE
PROTEIN: NEGATIVE mg/dL
SPECIFIC GRAVITY, URINE: 1.03 (ref 1.005–1.030)
SQUAMOUS EPITHELIAL / LPF: NONE SEEN
pH: 5 (ref 5.0–8.0)

## 2017-06-22 LAB — COMPREHENSIVE METABOLIC PANEL
ALBUMIN: 4 g/dL (ref 3.5–5.0)
ALT: 10 U/L — AB (ref 17–63)
AST: 17 U/L (ref 15–41)
Alkaline Phosphatase: 62 U/L (ref 38–126)
Anion gap: 8 (ref 5–15)
BUN: 13 mg/dL (ref 6–20)
CHLORIDE: 106 mmol/L (ref 101–111)
CO2: 27 mmol/L (ref 22–32)
CREATININE: 1.15 mg/dL (ref 0.61–1.24)
Calcium: 8.9 mg/dL (ref 8.9–10.3)
GFR calc Af Amer: >60 mL/min (ref >60)
Glucose, Bld: 113 mg/dL — ABNORMAL HIGH (ref 65–99)
Potassium: 3.1 mmol/L — ABNORMAL LOW (ref 3.5–5.1)
SODIUM: 141 mmol/L (ref 135–145)
Total Bilirubin: 0.9 mg/dL (ref 0.3–1.2)
Total Protein: 6.9 g/dL (ref 6.5–8.1)

## 2017-06-22 LAB — CBC WITH DIFFERENTIAL/PLATELET
Basophils Absolute: 0.1 10*3/uL (ref 0–0.1)
Basophils Relative: 0 %
EOS ABS: 0.1 10*3/uL (ref 0–0.7)
EOS PCT: 0 %
HCT: 43.9 % (ref 40.0–52.0)
HEMOGLOBIN: 15.1 g/dL (ref 13.0–18.0)
LYMPHS ABS: 2.4 10*3/uL (ref 1.0–3.6)
LYMPHS PCT: 17 %
MCH: 32 pg (ref 26.0–34.0)
MCHC: 34.5 g/dL (ref 32.0–36.0)
MCV: 92.9 fL (ref 80.0–100.0)
MONOS PCT: 5 %
Monocytes Absolute: 0.7 10*3/uL (ref 0.2–1.0)
Neutro Abs: 10.8 10*3/uL — ABNORMAL HIGH (ref 1.4–6.5)
Neutrophils Relative %: 78 %
PLATELETS: 184 10*3/uL (ref 150–440)
RBC: 4.72 MIL/uL (ref 4.40–5.90)
RDW: 12.9 % (ref 11.5–14.5)
WBC: 14 10*3/uL — AB (ref 3.8–10.6)

## 2017-06-22 LAB — LACTIC ACID, PLASMA: Lactic Acid, Venous: 1.8 mmol/L (ref 0.5–1.9)

## 2017-06-22 MED ORDER — SODIUM CHLORIDE 0.9 % IV BOLUS (SEPSIS)
1000.0000 mL | Freq: Once | INTRAVENOUS | Status: AC
  Administered 2017-06-22: 1000 mL via INTRAVENOUS

## 2017-06-22 MED ORDER — LIDOCAINE HCL (PF) 1 % IJ SOLN
10.0000 mL | Freq: Once | INTRAMUSCULAR | Status: AC
  Administered 2017-06-22: 10 mL
  Filled 2017-06-22: qty 10

## 2017-06-22 MED ORDER — CLINDAMYCIN HCL 300 MG PO CAPS: 300.0000 mg | ORAL_CAPSULE | Freq: Four times a day (QID) | ORAL | 0 refills | Status: DC

## 2017-06-22 MED ORDER — FENTANYL CITRATE (PF) 100 MCG/2ML IJ SOLN
50.0000 ug | Freq: Once | INTRAMUSCULAR | Status: AC
  Administered 2017-06-22: 50 ug via INTRAVENOUS
  Filled 2017-06-22: qty 2

## 2017-06-22 MED ORDER — IOPAMIDOL (ISOVUE-300) INJECTION 61%
100.0000 mL | Freq: Once | INTRAVENOUS | Status: AC | PRN
  Administered 2017-06-22: 100 mL via INTRAVENOUS
  Filled 2017-06-22: qty 100

## 2017-06-22 MED ORDER — CLINDAMYCIN PHOSPHATE 600 MG/50ML IV SOLN
600.0000 mg | Freq: Once | INTRAVENOUS | Status: AC
  Administered 2017-06-22: 600 mg via INTRAVENOUS
  Filled 2017-06-22: qty 50

## 2017-06-22 NOTE — ED Provider Notes (Signed)
[facility] Emergency Department Provider Note  ____________________________________________  Time seen: Approximately 5:09 PM  I have reviewed the triage vital signs and the nursing notes.   HISTORY  Chief Complaint No chief complaint on file.    HPI Ruben [name] is a 46 y.o. male who presents to emergency department complaining of a large coin abscess. Patient reports that he had a "small lesion" to the area 2 days prior. This is greatly increased in size, pain. Patient reports that he has no dysuria, polyuria, hematuria. Pain is significant at all times and worse with any movement or pressure to the area. Patient denies any history of recurring skin lesions. Patient denies any abdominal pain. He does report fevers at home but is unable to give a temperature max. He is on Vicodin for a right arm fracture that is being followed by orthopedics. Patient denies any headache, visual changes, chest pain, shortness of breath, abdominal pain, nausea or vomiting, diarrhea or constipation, urinary changes.   Past Medical History:  Diagnosis Date  . Anxiety   . Depression   . Influenza A     Patient Active Problem List   Diagnosis Date Noted  . Influenza A 12/03/2016  . Hypotension 12/03/2016  . Tachycardia 12/03/2016  . Fever 12/03/2016    Past Surgical History:  Procedure Laterality Date  . CHOLECYSTECTOMY    . FRACTURE SURGERY    . TONSILLECTOMY      Prior to Admission medications   Medication Sig Start Date End Date Taking? Authorizing Provider  albuterol (PROVENTIL HFA;VENTOLIN HFA) 108 (90 Base) MCG/ACT inhaler Inhale 2 puffs into the lungs every 6 (six) hours as needed for wheezing or shortness of breath. Patient not taking: Reported on 12/03/2016 02/15/16   Kem Boroughsriplett, Cari B, FNP  azithromycin (ZITHROMAX) 250 MG tablet Take 1 tablet (250 mg total) by mouth daily. 12/02/16   Minna AntisPaduchowski, Kevin, MD  clindamycin (CLEOCIN) 300 MG capsule Take 1 capsule  (300 mg total) by mouth 4 (four) times daily. 06/22/17   Cuthriell, Delorise RoyalsJonathan D, PA-C  guaiFENesin-codeine 100-10 MG/5ML syrup Take 5 mLs by mouth every 6 (six) hours as needed for cough. 12/02/16   Minna AntisPaduchowski, Kevin, MD  HYDROcodone-acetaminophen (NORCO/VICODIN) 5-325 MG tablet Take 1 tablet by mouth every 4 (four) hours as needed for moderate pain. 06/19/17 06/19/18  Triplett, Rulon Eisenmengerari B, FNP  ibuprofen (ADVIL,MOTRIN) 600 MG tablet Take 1 tablet (600 mg total) by mouth 3 (three) times daily. With food 01/15/17   Rockne MenghiniNorman, Anne-Caroline, MD  ketorolac (TORADOL) 10 MG tablet Take 1 tablet (10 mg total) by mouth every 6 (six) hours as needed for severe pain. 12/05/16   [name], Srikar, MD  oseltamivir (TAMIFLU) 75 MG capsule Take 1 capsule (75 mg total) by mouth 2 (two) times daily. 12/04/16   [name], Srikar, MD    Allergies Patient has no known allergies.  Family History  Problem Relation Age of Onset  . Diabetes Mother   . Diabetes Father     Social History Social History  Substance Use Topics  . Smoking status: Current Every Day Smoker    Packs/day: 0.75    Types: Cigarettes  . Smokeless tobacco: Never Used  . Alcohol use No     Comment: quit drinking and using illegal drugs in 2013     Review of Systems  Constitutional: No fever/chills Eyes: No visual changes. No discharge ENT: No upper respiratory complaints. Cardiovascular: no chest pain. Respiratory: no cough. No SOB. Gastrointestinal: No abdominal pain.  No nausea, no vomiting.  No diarrhea.  No constipation. Genitourinary: Negative for dysuria. No hematuria Musculoskeletal: Negative for musculoskeletal pain. Skin: Negative for rash, abrasions, lacerations, ecchymosis.Positive for large abscess to the right groin Neurological: Negative for headaches, focal weakness or numbness. 10-point ROS otherwise negative.  ____________________________________________   PHYSICAL EXAM:  VITAL SIGNS: ED Triage Vitals  Enc Vitals Group     BP  06/22/17 1655 120/65     Pulse Rate 06/22/17 1655 99     Resp 06/22/17 1655 18     Temp 06/22/17 1655 99.3 F (37.4 C)     Temp Source 06/22/17 1655 Oral     SpO2 06/22/17 1654 95 %     Weight 06/22/17 1657 240 lb (108.9 kg)     Height 06/22/17 1657 6\' 2"  (1.88 m)     Head Circumference --      Peak Flow --      Pain Score 06/22/17 1654 8     Pain Loc --      Pain Edu? --      Excl. in GC? --      Constitutional: Alert and oriented. Well appearing and in no acute distress. Eyes: Conjunctivae are normal. PERRL. EOMI. Head: Atraumatic. ENT:      Ears:       Nose: No congestion/rhinnorhea.      Mouth/Throat: Mucous membranes are moist.  Neck: No stridor.   Hematological/Lymphatic/Immunilogical: No cervical lymphadenopathy. No inguinal lymphadenopathy palpated. Cardiovascular: Normal rate, regular rhythm. Normal S1 and S2.  Good peripheral circulation. Respiratory: Normal respiratory effort without tachypnea or retractions. Lungs CTAB. Good air entry to the bases with no decreased or absent breath sounds. Gastrointestinal: Bowel sounds 4 quadrants. Soft and nontender to palpation. No guarding or rigidity. No palpable masses. No distention. No CVA tenderness. Musculoskeletal: Full range of motion to all extremities. No gross deformities appreciated. Neurologic:  Normal speech and language. No gross focal neurologic deficits are appreciated.  Skin:  Skin is warm, dry and intact. No rash noted. Large erythematous edematous, indurated and fluctuant lesion noted to the right groin. This is in the inguinal fold right side. Palpation reveals no obvious scrotal involvement. On the opposing skin surface, medial right thigh, skin changes consistent with fungal infection are appreciated. Abscess measures approximately 12 cm in length. Exquisitely tender to palpation. No drainage at this time. No palpable surrounding lymphadenopathy. No abdominal tenderness to palpation. Psychiatric: Mood and  affect are normal. Speech and behavior are normal. Patient exhibits appropriate insight and judgement.   ____________________________________________   LABS (all labs ordered are listed, but only abnormal results are displayed)  Labs Reviewed  COMPREHENSIVE METABOLIC PANEL - Abnormal; Notable for the following:       Result Value   Potassium 3.1 (*)    Glucose, Bld 113 (*)    ALT 10 (*)    All other components within normal limits  CBC WITH DIFFERENTIAL/PLATELET - Abnormal; Notable for the following:    WBC 14.0 (*)    Neutro Abs 10.8 (*)    All other components within normal limits  URINALYSIS, COMPLETE (UACMP) WITH MICROSCOPIC - Abnormal; Notable for the following:    Color, Urine YELLOW (*)    APPearance CLEAR (*)    All other components within normal limits  CULTURE, BLOOD (ROUTINE X 2)  CULTURE, BLOOD (ROUTINE X 2)  AEROBIC CULTURE (SUPERFICIAL SPECIMEN)  LACTIC ACID, PLASMA  LACTIC ACID, PLASMA   ____________________________________________  EKG   ____________________________________________  RADIOLOGY I,  Delorise Royals Cuthriell, personally viewed and evaluated these images (plain radiographs) as part of my medical decision making, as well as reviewing the written report by the radiologist.  Ct Pelvis W Contrast  Result Date: 06/22/2017 CLINICAL DATA:  Large groin abscess noticed yesterday. EXAM: CT PELVIS WITH CONTRAST TECHNIQUE: Multidetector CT imaging of the pelvis was performed using the standard protocol following the bolus administration of intravenous contrast. CONTRAST:  ISOVUE-300 IOPAMIDOL (ISOVUE-300) INJECTION 61% COMPARISON:  December 15, 2012 CT FINDINGS: Urinary Tract: The included lower poles both kidneys, ureters and bladder are unremarkable. Bowel: No acute bowel inflammation or obstruction. Normal-appearing appendix is seen. Vascular/Lymphatic: The distal aorta just above the bifurcation is normal in caliber. There is atherosclerosis of the common  iliac arteries and both internal iliacs. No aneurysm or dissection. No adenopathy. Reproductive: Normal size prostate and seminal vesicles without focal mass or inflammation. Other: Cellulitis with soft tissue induration and skin thickening noted along the medial and caudal aspect of the right buttock with a superficial enhancing fluid collection deep to the skin measuring 5.8 x 2.2 x 1.7 cm consistent with an abscess. Musculoskeletal: No intra muscular atrophy, abscess or mass. No fracture, dislocations or bone destruction identified of pelvis hips. No hip joint effusion. IMPRESSION: Cellulitis of the caudal and medial aspect of the right buttock associated with a 5.8 x 2.2 x 1.7 cm superficial abscess deep to the skin. Electronically Signed   By: Tollie Eth M.D.   On: 06/22/2017 19:04    ____________________________________________    PROCEDURES  Procedure(s) performed:    Marland KitchenMarland KitchenIncision and Drainage Date/Time: 06/22/2017 8:19 PM Performed by: Gala Romney D Authorized by: Gala Romney D   Consent:    Consent obtained:  Verbal   Consent given by:  Patient   Risks discussed:  Bleeding, incomplete drainage and pain Location:    Type:  Abscess   Size:  6x3 cm   Location:  Anogenital   Anogenital location:  Perineum Pre-procedure details:    Skin preparation:  Betadine Anesthesia (see MAR for exact dosages):    Anesthesia method:  Local infiltration   Local anesthetic:  Lidocaine 1% w/o epi Procedure type:    Complexity:  Complex Procedure details:    Incision types:  Single straight   Incision depth:  Subcutaneous   Scalpel blade:  11   Wound management:  Probed and deloculated, irrigated with saline, extensive cleaning and debrided   Drainage:  Purulent   Drainage amount:  Copious   Wound treatment:  Wound left open   Packing materials:  1/4 in iodoform gauze   Amount 1/4" iodoform:  8' Post-procedure details:    Patient tolerance of procedure:  Tolerated well,  no immediate complications      Medications  sodium chloride 0.9 % bolus 1,000 mL (0 mLs Intravenous Stopped 06/22/17 1838)  iopamidol (ISOVUE-300) 61 % injection 100 mL (100 mLs Intravenous Contrast Given 06/22/17 1832)  fentaNYL (SUBLIMAZE) injection 50 mcg (50 mcg Intravenous Given 06/22/17 1951)  clindamycin (CLEOCIN) IVPB 600 mg (600 mg Intravenous New Bag/Given 06/22/17 2007)  lidocaine (PF) (XYLOCAINE) 1 % injection 10 mL (10 mLs Infiltration Given 06/22/17 2007)     ____________________________________________   INITIAL IMPRESSION / ASSESSMENT AND PLAN / ED COURSE  Pertinent labs & imaging results that were available during my care of the patient were reviewed by me and considered in my medical decision making (see chart for details).  Review of the [place] CSRS was performed in accordance of the [facility]  prior to dispensing any controlled drugs.     Patient's diagnosis is consistent with Abscess to the perineum. Abscess is fluctuant, indurated, exquisitely tender. Due to its location, as well as size and presentation, labs, imaging were ordered. This reveals large loculated collection of purulent material. Superficial in nature. At this time, area is incised and drained and packed in the emergency department as described above. Patient is given IV antibiotics in the emergency department and will be discharged home with clindamycin. Patient is already on narcotics for previous wrist fracture and will continue same for pain management. Patient is given strict instructions to follow-up in 2 days for wound reevaluation as well as packing change. Patient is also given instructions to follow up sooner if symptoms worsen..  Patient is given ED precautions to return to the ED for any worsening or new symptoms.     ____________________________________________  FINAL CLINICAL IMPRESSION(S) / ED DIAGNOSES  Final diagnoses:  Perineal abscess      NEW MEDICATIONS STARTED DURING THIS  VISIT:  New Prescriptions   CLINDAMYCIN (CLEOCIN) 300 MG CAPSULE    Take 1 capsule (300 mg total) by mouth 4 (four) times daily.        This chart was dictated using voice recognition software/Dragon. Despite best efforts to proofread, errors can occur which can change the meaning. Any change was purely unintentional.    Lanette Hampshire 06/22/17 2119    Myrna Blazer, MD 06/22/17 2351

## 2017-06-22 NOTE — ED Triage Notes (Signed)
Pt. Has large abscess to groin area.  Pt. States he first noticed it yesterday.  Pt. States he broke rt. Arm early in week and is currently on Vicodin.

## 2017-06-22 NOTE — ED Notes (Signed)
Patient transported to CT 
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
## 2017-06-24 ENCOUNTER — Encounter: Payer: Self-pay | Admitting: Physician Assistant

## 2017-06-24 ENCOUNTER — Emergency Department
Admission: EM | Admit: 2017-06-24 | Discharge: 2017-06-24 | Disposition: A | Discharge status: Home / Self Care | Payer: Managed Care, Other (non HMO) | Attending: Student in an Organized Health Care Education/Training Program | Admitting: Student in an Organized Health Care Education/Training Program

## 2017-06-24 DIAGNOSIS — L0231 Cutaneous abscess of buttock: Secondary | ICD-10-CM | POA: Insufficient documentation

## 2017-06-24 DIAGNOSIS — Z09 Encounter for follow-up examination after completed treatment for conditions other than malignant neoplasm: Secondary | ICD-10-CM

## 2017-06-24 DIAGNOSIS — F17210 Nicotine dependence, cigarettes, uncomplicated: Secondary | ICD-10-CM | POA: Insufficient documentation

## 2017-06-24 DIAGNOSIS — Z79899 Other long term (current) drug therapy: Secondary | ICD-10-CM | POA: Insufficient documentation

## 2017-06-24 NOTE — ED Triage Notes (Signed)
Pt presents for wound check to right buttocks after being seen in ED with I&D and packing on abscess.

## 2017-06-24 NOTE — ED Provider Notes (Signed)
[facility] Emergency Department Provider Note ____________________________________________  Time seen: 1027  I have reviewed the triage vital signs and the nursing notes.  HISTORY  Chief Complaint  Wound Check  HPI Ruben [name] is a 46 y.o. male resents to the ED for wound check and packing removal today status post an I&D procedure. He denies any interim complaints. He is tolerating the previously prescribed antibiotics.  Past Medical History:  Diagnosis Date  . Anxiety   . Depression   . Influenza A     Patient Active Problem List   Diagnosis Date Noted  . Influenza A 12/03/2016  . Hypotension 12/03/2016  . Tachycardia 12/03/2016  . Fever 12/03/2016    Past Surgical History:  Procedure Laterality Date  . CHOLECYSTECTOMY    . FRACTURE SURGERY    . TONSILLECTOMY      Prior to Admission medications   Medication Sig Start Date End Date Taking? Authorizing Provider  albuterol (PROVENTIL HFA;VENTOLIN HFA) 108 (90 Base) MCG/ACT inhaler Inhale 2 puffs into the lungs every 6 (six) hours as needed for wheezing or shortness of breath. Patient not taking: Reported on 12/03/2016 02/15/16   Kem Boroughs B, FNP  azithromycin (ZITHROMAX) 250 MG tablet Take 1 tablet (250 mg total) by mouth daily. 12/02/16   Minna Antis, MD  clindamycin (CLEOCIN) 300 MG capsule Take 1 capsule (300 mg total) by mouth 4 (four) times daily. 06/22/17   Cuthriell, Delorise Royals, PA-C  guaiFENesin-codeine 100-10 MG/5ML syrup Take 5 mLs by mouth every 6 (six) hours as needed for cough. 12/02/16   Minna Antis, MD  HYDROcodone-acetaminophen (NORCO/VICODIN) 5-325 MG tablet Take 1 tablet by mouth every 4 (four) hours as needed for moderate pain. 06/19/17 06/19/18  Triplett, Rulon Eisenmenger B, FNP  ibuprofen (ADVIL,MOTRIN) 600 MG tablet Take 1 tablet (600 mg total) by mouth 3 (three) times daily. With food 01/15/17   Rockne Menghini, MD  ketorolac (TORADOL) 10 MG tablet Take 1 tablet (10 mg  total) by mouth every 6 (six) hours as needed for severe pain. 12/05/16   Milagros Loll, MD  oseltamivir (TAMIFLU) 75 MG capsule Take 1 capsule (75 mg total) by mouth 2 (two) times daily. 12/04/16   Milagros Loll, MD    Allergies Patient has no known allergies.  Family History  Problem Relation Age of Onset  . Diabetes Mother   . Diabetes Father     Social History Social History  Substance Use Topics  . Smoking status: Current Every Day Smoker    Packs/day: 0.75    Types: Cigarettes  . Smokeless tobacco: Never Used  . Alcohol use No     Comment: quit drinking and using illegal drugs in 2013    Review of Systems  Constitutional: Negative for fever. Cardiovascular: Negative for chest pain. Respiratory: Negative for shortness of breath. Musculoskeletal: Negative for back pain. Skin: Negative for rash. Right buttock abscess as above Neurological: Negative for headaches, focal weakness or numbness. ____________________________________________  PHYSICAL EXAM:  VITAL SIGNS: ED Triage Vitals  Enc Vitals Group     BP 06/24/17 1016 123/73     Pulse Rate 06/24/17 1016 84     Resp 06/24/17 1016 12     Temp 06/24/17 1016 98.2 F (36.8 C)     Temp Source 06/24/17 1016 Oral     SpO2 06/24/17 1016 97 %     Weight 06/24/17 1014 240 lb (108.9 kg)     Height 06/24/17 1014 6\' 2"  (1.88 m)  Head Circumference --      Peak Flow --      Pain Score 06/24/17 1014 8     Pain Loc --      Pain Edu? --      Excl. in GC? --     Constitutional: Alert and oriented. Well appearing and in no distress. Head: Normocephalic and atraumatic. Cardiovascular: Normal rate, regular rhythm. Normal distal pulses. Respiratory: Normal respiratory effort.  Skin:  Skin is warm, dry and intact. No rash noted. Well-healing right buttock abscess with packing material in place. Local erythema with well-granulating wound bed. No purulence noted with saline flush.   ____________________________________________  INITIAL IMPRESSION / ASSESSMENT AND PLAN / ED COURSE  Patient with the ED wound recheck of a previously drained right buttocks abscess. Wound is healing well. No packing is reapplied. He will continue with the previously prescribed antibiotics and return to follow with his primary care provider for interim wound checks. ____________________________________________  FINAL CLINICAL IMPRESSION(S) / ED DIAGNOSES  Final diagnoses:  Encounter for recheck of abscess following incision and drainage     Jules Vidovich, Charlesetta IvoryJenise V Bacon, PA-C 06/24/17 1840    Willy Eddyobinson, Patrick, MD 06/24/17 2320

## 2017-06-24 NOTE — Discharge Instructions (Signed)
Keep the wound clean, dry, and covered. Apply warm compresses to promote healing. Take the antibiotic as previously prescribed.

## 2017-06-26 LAB — AEROBIC CULTURE W GRAM STAIN (SUPERFICIAL SPECIMEN)

## 2017-06-26 LAB — AEROBIC CULTURE  (SUPERFICIAL SPECIMEN): SPECIAL REQUESTS: NORMAL

## 2017-06-27 LAB — CULTURE, BLOOD (ROUTINE X 2)
Culture: NO GROWTH
Culture: NO GROWTH
Special Requests: ADEQUATE
Special Requests: ADEQUATE

## 2017-06-27 NOTE — Plan of Care (Signed)
8/14 culture report positive. Discussed with Dr Mayford KnifeWilliams regarding few diphtheroids, no change in outpatient therapy warranted at this time.  Ruben [name], PharmD, MBA, Liz ClaiborneBCGP Clinical Pharmacist [facility]

## 2018-06-04 ENCOUNTER — Other Ambulatory Visit: Payer: Self-pay

## 2018-06-04 ENCOUNTER — Emergency Department
Admission: EM | Admit: 2018-06-04 | Discharge: 2018-06-04 | Disposition: A | Discharge status: Home / Self Care | Payer: Self-pay | Attending: Emergency Medicine | Admitting: Emergency Medicine

## 2018-06-04 ENCOUNTER — Emergency Department: Payer: Self-pay

## 2018-06-04 DIAGNOSIS — F17210 Nicotine dependence, cigarettes, uncomplicated: Secondary | ICD-10-CM | POA: Insufficient documentation

## 2018-06-04 DIAGNOSIS — Z79899 Other long term (current) drug therapy: Secondary | ICD-10-CM | POA: Insufficient documentation

## 2018-06-04 DIAGNOSIS — R109 Unspecified abdominal pain: Secondary | ICD-10-CM

## 2018-06-04 DIAGNOSIS — R1011 Right upper quadrant pain: Secondary | ICD-10-CM | POA: Insufficient documentation

## 2018-06-04 LAB — CBC
HCT: 39.1 % — ABNORMAL LOW (ref 40.0–52.0)
Hemoglobin: 14 g/dL (ref 13.0–18.0)
MCH: 33.2 pg (ref 26.0–34.0)
MCHC: 35.8 g/dL (ref 32.0–36.0)
MCV: 92.9 fL (ref 80.0–100.0)
PLATELETS: 208 10*3/uL (ref 150–440)
RBC: 4.21 MIL/uL — AB (ref 4.40–5.90)
RDW: 12.4 % (ref 11.5–14.5)
WBC: 9.5 10*3/uL (ref 3.8–10.6)

## 2018-06-04 LAB — COMPREHENSIVE METABOLIC PANEL
ALT: 13 U/L (ref 0–44)
AST: 18 U/L (ref 15–41)
Albumin: 3.4 g/dL — ABNORMAL LOW (ref 3.5–5.0)
Alkaline Phosphatase: 49 U/L (ref 38–126)
Anion gap: 5 (ref 5–15)
BUN: 13 mg/dL (ref 6–20)
CALCIUM: 8.4 mg/dL — AB (ref 8.9–10.3)
CHLORIDE: 105 mmol/L (ref 98–111)
CO2: 27 mmol/L (ref 22–32)
CREATININE: 1.05 mg/dL (ref 0.61–1.24)
Glucose, Bld: 112 mg/dL — ABNORMAL HIGH (ref 70–99)
Potassium: 3.6 mmol/L (ref 3.5–5.1)
Sodium: 137 mmol/L (ref 135–145)
Total Bilirubin: 0.5 mg/dL (ref 0.3–1.2)
Total Protein: 6.1 g/dL — ABNORMAL LOW (ref 6.5–8.1)

## 2018-06-04 LAB — URINALYSIS, COMPLETE (UACMP) WITH MICROSCOPIC
Bacteria, UA: NONE SEEN
Bilirubin Urine: NEGATIVE
GLUCOSE, UA: NEGATIVE mg/dL
HGB URINE DIPSTICK: NEGATIVE
KETONES UR: NEGATIVE mg/dL
Leukocytes, UA: NEGATIVE
Nitrite: NEGATIVE
PROTEIN: NEGATIVE mg/dL
Specific Gravity, Urine: 1.015 (ref 1.005–1.030)
Squamous Epithelial / LPF: NONE SEEN (ref 0–5)
pH: 5 (ref 5.0–8.0)

## 2018-06-04 LAB — LIPASE, BLOOD: LIPASE: 40 U/L (ref 11–51)

## 2018-06-04 MED ORDER — KETOROLAC TROMETHAMINE 30 MG/ML IJ SOLN
30.0000 mg | Freq: Once | INTRAMUSCULAR | Status: AC
  Administered 2018-06-04: 30 mg via INTRAVENOUS
  Filled 2018-06-04: qty 1

## 2018-06-04 MED ORDER — SODIUM CHLORIDE 0.9 % IV BOLUS
1000.0000 mL | Freq: Once | INTRAVENOUS | Status: AC
  Administered 2018-06-04: 1000 mL via INTRAVENOUS

## 2018-06-04 MED ORDER — MORPHINE SULFATE (PF) 4 MG/ML IV SOLN
4.0000 mg | Freq: Once | INTRAVENOUS | Status: AC
  Administered 2018-06-04: 4 mg via INTRAVENOUS
  Filled 2018-06-04: qty 1

## 2018-06-04 MED ORDER — ONDANSETRON HCL 4 MG/2ML IJ SOLN
4.0000 mg | Freq: Once | INTRAMUSCULAR | Status: AC
  Administered 2018-06-04: 4 mg via INTRAVENOUS
  Filled 2018-06-04: qty 2

## 2018-06-04 NOTE — ED Triage Notes (Signed)
Pt arrives to ED via POV from home with c/o RUQ abdominal pain x30 minutes. Pt denies any c/o N/V/D or fever. Pt reports having his gall bladder removed in 2013-2014.

## 2018-06-04 NOTE — Discharge Instructions (Signed)
Please follow up with your primary care physician for further evaluation of your symptoms.  °

## 2018-06-04 NOTE — ED Provider Notes (Signed)
[facility] Emergency Department Provider Note   ____________________________________________   First MD Initiated Contact with Patient 06/04/18 [phone]     (approximate)  I have reviewed the triage vital signs and the nursing notes.   HISTORY  Chief Complaint Abdominal Pain    HPI Ruben [name] is a 47 y.o. male who comes into the hospital today with some sharp pain in his right side.  The patient states that the pain woke him up out of his sleep.  He states that it eases when he places pressure to his right side but it comes back as soon as he stops.  The patient denies ever having this before.  He denies any fevers, nausea, vomiting, diarrhea or constipation.  He rates his pain an 8 out of 10 in intensity.  The patient endorses some shortness of breath with his pain but states that when he takes a deep breath it hurts in his right upper quadrant.  He denies any chest pain.  He has not urinated so he is unsure if he had blood in his urine.  The patient states that the pain was so bad it scared him so he came in for evaluation.   Past Medical History:  Diagnosis Date  . Anxiety   . Depression   . Influenza A     Patient Active Problem List   Diagnosis Date Noted  . Influenza A 12/03/2016  . Hypotension 12/03/2016  . Tachycardia 12/03/2016  . Fever 12/03/2016    Past Surgical History:  Procedure Laterality Date  . CHOLECYSTECTOMY    . FRACTURE SURGERY    . TONSILLECTOMY      Prior to Admission medications   Medication Sig Start Date End Date Taking? Authorizing Provider  albuterol (PROVENTIL HFA;VENTOLIN HFA) 108 (90 Base) MCG/ACT inhaler Inhale 2 puffs into the lungs every 6 (six) hours as needed for wheezing or shortness of breath. Patient not taking: Reported on 12/03/2016 02/15/16   Kem Boroughs B, FNP  azithromycin (ZITHROMAX) 250 MG tablet Take 1 tablet (250 mg total) by mouth daily. 12/02/16   Minna Antis, MD  clindamycin  (CLEOCIN) 300 MG capsule Take 1 capsule (300 mg total) by mouth 4 (four) times daily. 06/22/17   Cuthriell, Delorise Royals, PA-C  guaiFENesin-codeine 100-10 MG/5ML syrup Take 5 mLs by mouth every 6 (six) hours as needed for cough. 12/02/16   Minna Antis, MD  HYDROcodone-acetaminophen (NORCO/VICODIN) 5-325 MG tablet Take 1 tablet by mouth every 4 (four) hours as needed for moderate pain. 06/19/17 06/19/18  Triplett, Rulon Eisenmenger B, FNP  ibuprofen (ADVIL,MOTRIN) 600 MG tablet Take 1 tablet (600 mg total) by mouth 3 (three) times daily. With food 01/15/17   Rockne Menghini, MD  ketorolac (TORADOL) 10 MG tablet Take 1 tablet (10 mg total) by mouth every 6 (six) hours as needed for severe pain. 12/05/16   Milagros Loll, MD  oseltamivir (TAMIFLU) 75 MG capsule Take 1 capsule (75 mg total) by mouth 2 (two) times daily. 12/04/16   Milagros Loll, MD    Allergies Patient has no known allergies.  Family History  Problem Relation Age of Onset  . Diabetes Mother   . Diabetes Father     Social History Social History   Tobacco Use  . Smoking status: Current Every Day Smoker    Packs/day: 0.75    Types: Cigarettes  . Smokeless tobacco: Never Used  Substance Use Topics  . Alcohol use: No    Comment: quit drinking and  using illegal drugs in 2013  . Drug use: No    Review of Systems  Constitutional: No fever/chills Eyes: No visual changes. ENT: No sore throat. Cardiovascular:  No chest pain. Respiratory: shortness of breath with pain to the right side Gastrointestinal:  abdominal pain.  nausea, no vomiting.  No diarrhea.  No constipation. Genitourinary: Negative for dysuria. Musculoskeletal:  back pain. Skin: Negative for rash. Neurological: Negative for headaches, focal weakness or numbness.   ____________________________________________   PHYSICAL EXAM:  VITAL SIGNS: ED Triage Vitals  Enc Vitals Group     BP 06/04/18 0609 112/75     Pulse Rate 06/04/18 0609 75     Resp 06/04/18 0609 18      Temp 06/04/18 0609 99.2 F (37.3 C)     Temp Source 06/04/18 0609 Oral     SpO2 06/04/18 0609 98 %     Weight 06/04/18 0603 252 lb (114.3 kg)     Height 06/04/18 0603 6\' 2"  (1.88 m)     Head Circumference --      Peak Flow --      Pain Score 06/04/18 0603 8     Pain Loc --      Pain Edu? --      Excl. in GC? --     Constitutional: Alert and oriented. Well appearing and in moderate distress. Eyes: Conjunctivae are normal. PERRL. EOMI. Head: Atraumatic. Nose: No congestion/rhinnorhea. Mouth/Throat: Mucous membranes are moist.  Oropharynx non-erythematous. Cardiovascular: Normal rate, regular rhythm. Grossly normal heart sounds.  Good peripheral circulation. Respiratory: Normal respiratory effort.  No retractions. Lungs CTAB. Gastrointestinal: Soft with some right upper quadrant tenderness to palpation. No distention.  Positive bowel sounds, right CVA tenderness to palpation Musculoskeletal: No lower extremity tenderness nor edema.   Neurologic:  Normal speech and language.  Skin:  Skin is warm, dry and intact.  Psychiatric: Mood and affect are normal.   ____________________________________________   LABS (all labs ordered are listed, but only abnormal results are displayed)  Labs Reviewed  COMPREHENSIVE METABOLIC PANEL - Abnormal; Notable for the following components:      Result Value   Glucose, Bld 112 (*)    Calcium 8.4 (*)    Total Protein 6.1 (*)    Albumin 3.4 (*)    All other components within normal limits  CBC - Abnormal; Notable for the following components:   RBC 4.21 (*)    HCT 39.1 (*)    All other components within normal limits  URINALYSIS, COMPLETE (UACMP) WITH MICROSCOPIC - Abnormal; Notable for the following components:   Color, Urine YELLOW (*)    APPearance CLEAR (*)    All other components within normal limits  LIPASE, BLOOD    ____________________________________________  EKG  none ____________________________________________  RADIOLOGY  ED MD interpretation:  CT renal stone study: Small nonobstructing calculi in each kidney, no hydronephrosis on either side, no ureteral calculi on either side, no evident bowel obstruction, no abscess in the abdomen or pelvis, appendix appears normal.  Splenomegaly of uncertain etiology, no focal splenic lesions evident, gallbladder absent, small hiatal hernia, aortoiliac atherosclerosis.  Official radiology report(s): Ct Renal Stone Study  Result Date: 06/04/2018 CLINICAL DATA:  Right-sided pain EXAM: CT ABDOMEN AND PELVIS WITHOUT CONTRAST TECHNIQUE: Multidetector CT imaging of the abdomen and pelvis was performed following the standard protocol without oral or IV contrast. COMPARISON:  CT abdomen and pelvis December 15, 2012; pelvis CT June 22, 2017 FINDINGS: Lower chest: No evident lung base edema or  consolidation. There is a small hiatal hernia. Hepatobiliary: No focal liver lesions are apparent on this noncontrast enhanced study. Gallbladder is absent. There is no biliary duct dilatation. Pancreas: No pancreatic mass or inflammatory focus. Spleen: Spleen measures 16.2 x 12.1 x 8.2 cm with a measured splenic volume of 804 cubic cm. No focal splenic lesions are apparent. Adrenals/Urinary Tract: Adrenals bilaterally appear unremarkable. Kidneys bilaterally show no evident mass or hydronephrosis on either side. There is a 3 mm calculus in the upper to mid right kidney. There is a 2 mm calculus in the mid right kidney. On the left, there is a 2 mm calculus in the lower pole region. There is no evident ureteral calculus on either side. Urinary bladder is virtually empty. Urinary bladder wall thickness is within normal limits for nearly empty bladder state. Stomach/Bowel: There is moderate stool in the colon. There is no appreciable bowel wall or mesenteric thickening. No evident bowel  obstruction. No free air or portal venous air. Vascular/Lymphatic: No abdominal aortic aneurysm. There are foci of atherosclerotic calcification in the aorta and common iliac arteries. Major mesenteric arterial vessels appear patent on this noncontrast enhanced study. There are scattered subcentimeter retroperitoneal and inguinal lymph nodes. No adenopathy by size criteria is evident on this study. Reproductive: Prostate and seminal vesicles are normal in size and contour. No pelvic mass evident. Other: Appendix appears normal. There is no abscess or ascites in the abdomen or pelvis. Musculoskeletal: There is degenerative change in the lower thoracic and lumbar regions. There are no blastic or lytic bone lesions. There is no intramuscular or abdominal wall lesion evident. IMPRESSION: 1. Small nonobstructing calculi in each kidney. No hydronephrosis on either side. No ureteral calculi on either side. 2. No evident bowel obstruction. No abscess in the abdomen or pelvis. Appendix appears normal. 3. Splenomegaly of uncertain etiology. No focal splenic lesions evident. 4.  Gallbladder absent. 5.  Small hiatal hernia. 6.  Aortoiliac atherosclerosis. Aortic Atherosclerosis (ICD10-I70.0). Electronically Signed   By: Bretta Bang III M.D.   On: 06/04/2018 08:10    ____________________________________________   PROCEDURES  Procedure(s) performed: None  Procedures  Critical Care performed: No  ____________________________________________   INITIAL IMPRESSION / ASSESSMENT AND PLAN / ED COURSE  As part of my medical decision making, I reviewed the following data within the electronic MEDICAL RECORD NUMBER Notes from prior ED visits and [place] Controlled Substance Database   This is a 47 year old male who comes into the hospital today with some sharp pain in his right side.  The pain wraps around from his right side to his flank and it feels better when he pushes on it.  My differential diagnosis includes  gallbladder disease versus kidney stone versus urinary tract infection.  We did check some blood work to include a CBC CMP lipase and urinalysis which were all unremarkable.  I sent the patient for a CT scan renal stone study and while it showed some small nonobstructing calculi in each kidney there was no hydronephrosis and no ureteral calculi.  The patient did receive a dose of morphine and Zofran and I also give him a dose of Toradol.  He will be discharged home and encouraged to follow-up with his primary care physician.  He is to return with any worsening symptoms or any other concerns.      ____________________________________________   FINAL CLINICAL IMPRESSION(S) / ED DIAGNOSES  Final diagnoses:  Right upper quadrant abdominal pain  Flank pain     ED Discharge Orders  None       Note:  This document was prepared using Dragon voice recognition software and may include unintentional dictation errors.   Rebecka ApleyWebster, Allison P, MD 06/04/18 [phone]

## 2018-10-27 ENCOUNTER — Other Ambulatory Visit: Payer: Self-pay

## 2018-10-27 ENCOUNTER — Emergency Department: Payer: Self-pay

## 2018-10-27 ENCOUNTER — Emergency Department
Admission: EM | Admit: 2018-10-27 | Discharge: 2018-10-27 | Disposition: A | Discharge status: Home / Self Care | Payer: Self-pay | Attending: Emergency Medicine | Admitting: Emergency Medicine

## 2018-10-27 DIAGNOSIS — Y929 Unspecified place or not applicable: Secondary | ICD-10-CM | POA: Insufficient documentation

## 2018-10-27 DIAGNOSIS — J111 Influenza due to unidentified influenza virus with other respiratory manifestations: Secondary | ICD-10-CM | POA: Insufficient documentation

## 2018-10-27 DIAGNOSIS — Y939 Activity, unspecified: Secondary | ICD-10-CM | POA: Insufficient documentation

## 2018-10-27 DIAGNOSIS — R69 Illness, unspecified: Secondary | ICD-10-CM

## 2018-10-27 DIAGNOSIS — F17210 Nicotine dependence, cigarettes, uncomplicated: Secondary | ICD-10-CM | POA: Insufficient documentation

## 2018-10-27 DIAGNOSIS — S93421A Sprain of deltoid ligament of right ankle, initial encounter: Secondary | ICD-10-CM | POA: Insufficient documentation

## 2018-10-27 DIAGNOSIS — Y999 Unspecified external cause status: Secondary | ICD-10-CM | POA: Insufficient documentation

## 2018-10-27 DIAGNOSIS — X58XXXA Exposure to other specified factors, initial encounter: Secondary | ICD-10-CM | POA: Insufficient documentation

## 2018-10-27 DIAGNOSIS — Z79899 Other long term (current) drug therapy: Secondary | ICD-10-CM | POA: Insufficient documentation

## 2018-10-27 MED ORDER — ACETAMINOPHEN 325 MG PO TABS
ORAL_TABLET | ORAL | Status: AC
  Filled 2018-10-27: qty 2

## 2018-10-27 MED ORDER — ACETAMINOPHEN 325 MG PO TABS
650.0000 mg | ORAL_TABLET | Freq: Once | ORAL | Status: AC | PRN
  Administered 2018-10-27: 650 mg via ORAL

## 2018-10-27 MED ORDER — BENZONATATE 100 MG PO CAPS: 100.0000 mg | ORAL_CAPSULE | Freq: Three times a day (TID) | ORAL | 0 refills | Status: AC | PRN

## 2018-10-27 MED ORDER — MELOXICAM 15 MG PO TABS: 15.0000 mg | ORAL_TABLET | Freq: Every day | ORAL | 1 refills | Status: AC

## 2018-10-27 NOTE — ED Provider Notes (Signed)
[facility] Emergency Department Provider Note  ____________________________________________  Time seen: Approximately 8:45 PM  I have reviewed the triage vital signs and the nursing notes.   HISTORY  Chief Complaint URI and Ankle Pain    HPI Ruben [name] is a 47 y.o. male presents to the emergency department with rhinorrhea, congestion, nonproductive cough and fever for the past 2 to 3 days as well as body aches.  Patient secondarily complains of acute 6 out of 10 right medial ankle pain without provocative injury.  He denies prior ankle sprains in the past.  No prior right ankle fractures.  Patient has sick contacts in the home with similar viral URI like symptoms.  No recent travel.  Patient denies chest pain, chest tightness or abdominal pain.   Past Medical History:  Diagnosis Date  . Anxiety   . Depression   . Influenza A     Patient Active Problem List   Diagnosis Date Noted  . Influenza A 12/03/2016  . Hypotension 12/03/2016  . Tachycardia 12/03/2016  . Fever 12/03/2016    Past Surgical History:  Procedure Laterality Date  . CHOLECYSTECTOMY    . FRACTURE SURGERY    . TONSILLECTOMY      Prior to Admission medications   Medication Sig Start Date End Date Taking? Authorizing Provider  albuterol (PROVENTIL HFA;VENTOLIN HFA) 108 (90 Base) MCG/ACT inhaler Inhale 2 puffs into the lungs every 6 (six) hours as needed for wheezing or shortness of breath. Patient not taking: Reported on 12/03/2016 02/15/16   Kem Boroughsriplett, Cari B, FNP  azithromycin (ZITHROMAX) 250 MG tablet Take 1 tablet (250 mg total) by mouth daily. 12/02/16   Minna AntisPaduchowski, Kevin, MD  benzonatate (TESSALON PERLES) 100 MG capsule Take 1 capsule (100 mg total) by mouth 3 (three) times daily as needed for up to 7 days for cough. 10/27/18 11/03/18  Orvil FeilWoods, Lennyn Bellanca M, PA-C  clindamycin (CLEOCIN) 300 MG capsule Take 1 capsule (300 mg total) by mouth 4 (four) times daily. 06/22/17   Cuthriell,  Delorise RoyalsJonathan D, PA-C  guaiFENesin-codeine 100-10 MG/5ML syrup Take 5 mLs by mouth every 6 (six) hours as needed for cough. 12/02/16   Minna AntisPaduchowski, Kevin, MD  ibuprofen (ADVIL,MOTRIN) 600 MG tablet Take 1 tablet (600 mg total) by mouth 3 (three) times daily. With food 01/15/17   Rockne MenghiniNorman, Anne-Caroline, MD  ketorolac (TORADOL) 10 MG tablet Take 1 tablet (10 mg total) by mouth every 6 (six) hours as needed for severe pain. 12/05/16   [name], Srikar, MD  meloxicam (MOBIC) 15 MG tablet Take 1 tablet (15 mg total) by mouth daily for 7 days. 10/27/18 11/03/18  Orvil FeilWoods, Kemper Heupel M, PA-C  oseltamivir (TAMIFLU) 75 MG capsule Take 1 capsule (75 mg total) by mouth 2 (two) times daily. 12/04/16   [name], Srikar, MD    Allergies Patient has no known allergies.  Family History  Problem Relation Age of Onset  . Diabetes Mother   . Diabetes Father     Social History Social History   Tobacco Use  . Smoking status: Current Every Day Smoker    Packs/day: 0.75    Types: Cigarettes  . Smokeless tobacco: Never Used  Substance Use Topics  . Alcohol use: No    Comment: quit drinking and using illegal drugs in 2013  . Drug use: No     Review of Systems  Constitutional: Patient has fever.  Eyes: No visual changes. No discharge ENT: Patient has nasal congestion and pharyngitis. Cardiovascular: no chest pain. Respiratory:  Patient has cough.  No SOB. Gastrointestinal: No abdominal pain.  No nausea, no vomiting.  No diarrhea.  No constipation. Genitourinary: Negative for dysuria. No hematuria Musculoskeletal: Patient has myalgias and right ankle pain. Skin: Negative for rash, abrasions, lacerations, ecchymosis. Neurological: Negative for headaches, focal weakness or numbness.   ____________________________________________   PHYSICAL EXAM:  VITAL SIGNS: ED Triage Vitals  Enc Vitals Group     BP 10/27/18 1737 131/72     Pulse Rate 10/27/18 1737 100     Resp 10/27/18 1737 18     Temp 10/27/18 1737 (!) 100.6  F (38.1 C)     Temp Source 10/27/18 1737 Oral     SpO2 10/27/18 1737 96 %     Weight 10/27/18 1737 262 lb (118.8 kg)     Height 10/27/18 1737 6\' 2"  (1.88 m)     Head Circumference --      Peak Flow --      Pain Score 10/27/18 1736 4     Pain Loc --      Pain Edu? --      Excl. in GC? --      Constitutional: Alert and oriented. Well appearing and in no acute distress. Eyes: Bilateral conjunctivitis visualized.  PERRL. EOMI. Head: Atraumatic. ENT:      Ears: TMs are injected.      Nose: No congestion/rhinnorhea.      Mouth/Throat: Mucous membranes are moist.  Posterior pharynx is erythematous. Neck: No stridor.  No cervical spine tenderness to palpation. Cardiovascular: Normal rate, regular rhythm. Normal S1 and S2.  Good peripheral circulation. Respiratory: Normal respiratory effort without tachypnea or retractions. Lungs CTAB. Good air entry to the bases with no decreased or absent breath sounds. Gastrointestinal: Bowel sounds 4 quadrants. Soft and nontender to palpation. No guarding or rigidity. No palpable masses. No distention. No CVA tenderness. Musculoskeletal: Patient is able to perform full range of motion at the right ankle.  Tenderness is elicited with palpation over the deltoid ligament.  Patient is able to move all 5 right toes.  Palpable dorsalis pedis pulse, right. Neurologic:  Normal speech and language. No gross focal neurologic deficits are appreciated.  Skin:  Skin is warm, dry and intact. No rash noted. Psychiatric: Mood and affect are normal. Speech and behavior are normal. Patient exhibits appropriate insight and judgement.   ____________________________________________   LABS (all labs ordered are listed, but only abnormal results are displayed)  Labs Reviewed - No data to display ____________________________________________  EKG   ____________________________________________  RADIOLOGY I personally viewed and evaluated these images as part of my  medical decision making, as well as reviewing the written report by the radiologist.  Dg Ankle Complete Right  Result Date: 10/27/2018 CLINICAL DATA:  Twisted ankle.  Pain EXAM: RIGHT ANKLE - COMPLETE 3+ VIEW COMPARISON:  None. FINDINGS: There is no evidence of fracture, dislocation, or joint effusion. There is no evidence of arthropathy or other focal bone abnormality. Soft tissues are unremarkable. IMPRESSION: Negative. Electronically Signed   By: Charlett Nose M.D.   On: 10/27/2018 18:32    ____________________________________________    PROCEDURES  Procedure(s) performed:    Procedures    Medications  acetaminophen (TYLENOL) 325 MG tablet (  Not Given 10/27/18 1838)  acetaminophen (TYLENOL) tablet 650 mg (650 mg Oral Given 10/27/18 1740)     ____________________________________________   INITIAL IMPRESSION / ASSESSMENT AND PLAN / ED COURSE  Pertinent labs & imaging results that were available during my care of  the patient were reviewed by me and considered in my medical decision making (see chart for details).  Review of the [place] CSRS was performed in accordance of the [facility] prior to dispensing any controlled drugs.      Assessment and plan Right ankle sprain Viral URI Patient presents to the emergency department with rhinorrhea, congestion, nonproductive cough and body aches for the past 2 to 3 days.  History and physical exam findings are consistent with influenza-like illness.  Patient is outside of the therapeutic window for Tamiflu at this time.  Tylenol was recommended for fever.  Patient secondarily complained of right medial ankle pain.  On physical exam, patient had tenderness with palpation over the deltoid ligament.  An Ace wrap was applied in the emergency department and patient was discharged with meloxicam.  Strict return precautions were given to return to the emergency department for new or worsening symptoms.  All patient questions were  answered.    ____________________________________________  FINAL CLINICAL IMPRESSION(S) / ED DIAGNOSES  Final diagnoses:  Influenza-like illness  Sprain of deltoid ligament of right ankle, initial encounter      NEW MEDICATIONS STARTED DURING THIS VISIT:  ED Discharge Orders         Ordered    meloxicam (MOBIC) 15 MG tablet  Daily     10/27/18 2042    benzonatate (TESSALON PERLES) 100 MG capsule  3 times daily PRN     10/27/18 2042              This chart was dictated using voice recognition software/Dragon. Despite best efforts to proofread, errors can occur which can change the meaning. Any change was purely unintentional.    Orvil Feil, PA-C 10/27/18 2048    Jene Every, MD 10/27/18 2052

## 2018-10-27 NOTE — ED Triage Notes (Addendum)
URI symptoms. Very congested. Also here for R ankle pain that began today. A&O, no distress noted.

## 2018-10-27 NOTE — ED Notes (Signed)
Pt up to restroom.

## 2018-10-27 NOTE — ED Notes (Signed)
No peripheral IV placed this visit.    Discharge instructions reviewed with patient. Questions fielded by this RN. Patient verbalizes understanding of instructions. Patient discharged home in stable condition per provider. No acute distress noted at time of discharge.    

## 2018-10-27 NOTE — ED Notes (Signed)
Pt states got up this morning and right ankle "gave out" on him and he fell. Pt denies any other injury or pain from fall. Pt states pain when baring weight. Pt noted up to bathroom applying minimal weight.  Pt also has c/o of nasal congestion that started a "few days ago" and fever that started today. Pt denies cough.

## 2018-12-28 ENCOUNTER — Emergency Department: Payer: Self-pay

## 2018-12-28 ENCOUNTER — Encounter: Payer: Self-pay | Admitting: Emergency Medicine

## 2018-12-28 ENCOUNTER — Other Ambulatory Visit: Payer: Self-pay

## 2018-12-28 ENCOUNTER — Emergency Department
Admission: EM | Admit: 2018-12-28 | Discharge: 2018-12-28 | Disposition: A | Discharge status: Home / Self Care | Payer: Self-pay | Attending: Emergency Medicine | Admitting: Emergency Medicine

## 2018-12-28 DIAGNOSIS — I8002 Phlebitis and thrombophlebitis of superficial vessels of left lower extremity: Secondary | ICD-10-CM | POA: Insufficient documentation

## 2018-12-28 DIAGNOSIS — F17210 Nicotine dependence, cigarettes, uncomplicated: Secondary | ICD-10-CM | POA: Insufficient documentation

## 2018-12-28 DIAGNOSIS — F419 Anxiety disorder, unspecified: Secondary | ICD-10-CM | POA: Insufficient documentation

## 2018-12-28 LAB — COMPREHENSIVE METABOLIC PANEL
ALK PHOS: 53 U/L (ref 38–126)
ALT: 13 U/L (ref 0–44)
AST: 20 U/L (ref 15–41)
Albumin: 3.6 g/dL (ref 3.5–5.0)
Anion gap: 5 (ref 5–15)
BILIRUBIN TOTAL: 0.5 mg/dL (ref 0.3–1.2)
BUN: 14 mg/dL (ref 6–20)
CALCIUM: 8 mg/dL — AB (ref 8.9–10.3)
CHLORIDE: 104 mmol/L (ref 98–111)
CO2: 28 mmol/L (ref 22–32)
CREATININE: 0.92 mg/dL (ref 0.61–1.24)
GFR calc Af Amer: >60 mL/min (ref >60)
Glucose, Bld: 164 mg/dL — ABNORMAL HIGH (ref 70–99)
Potassium: 3.9 mmol/L (ref 3.5–5.1)
Sodium: 137 mmol/L (ref 135–145)
TOTAL PROTEIN: 6.5 g/dL (ref 6.5–8.1)

## 2018-12-28 LAB — CBC WITH DIFFERENTIAL/PLATELET
ABS IMMATURE GRANULOCYTES: 0.03 10*3/uL (ref 0.00–0.07)
Basophils Absolute: 0.1 10*3/uL (ref 0.0–0.1)
Basophils Relative: 1 %
Eosinophils Absolute: 0.2 10*3/uL (ref 0.0–0.5)
Eosinophils Relative: 3 %
HEMATOCRIT: 43.1 % (ref 39.0–52.0)
HEMOGLOBIN: 14.5 g/dL (ref 13.0–17.0)
IMMATURE GRANULOCYTES: 0 %
Lymphocytes Relative: 34 %
Lymphs Abs: 2.6 10*3/uL (ref 0.7–4.0)
MCH: 31 pg (ref 26.0–34.0)
MCHC: 33.6 g/dL (ref 30.0–36.0)
MCV: 92.3 fL (ref 80.0–100.0)
MONOS PCT: 5 %
Monocytes Absolute: 0.4 10*3/uL (ref 0.1–1.0)
NEUTROS ABS: 4.4 10*3/uL (ref 1.7–7.7)
NEUTROS PCT: 57 %
Platelets: 210 10*3/uL (ref 150–400)
RBC: 4.67 MIL/uL (ref 4.22–5.81)
RDW: 12.3 % (ref 11.5–15.5)
WBC: 7.7 10*3/uL (ref 4.0–10.5)
nRBC: 0 % (ref 0.0–0.2)

## 2018-12-28 MED ORDER — OXYCODONE-ACETAMINOPHEN 5-325 MG PO TABS
1.0000 | ORAL_TABLET | Freq: Once | ORAL | Status: AC
  Administered 2018-12-28: 1 via ORAL
  Filled 2018-12-28: qty 1

## 2018-12-28 MED ORDER — OXYCODONE-ACETAMINOPHEN 7.5-325 MG PO TABS: 1.0000 | ORAL_TABLET | Freq: Four times a day (QID) | ORAL | 0 refills | Status: DC | PRN

## 2018-12-28 MED ORDER — IBUPROFEN 600 MG PO TABS
600.0000 mg | ORAL_TABLET | Freq: Once | ORAL | Status: AC
  Administered 2018-12-28: 600 mg via ORAL
  Filled 2018-12-28: qty 1

## 2018-12-28 NOTE — ED Triage Notes (Signed)
Pt arrives with complaints of left lower leg swelling. Pt states the swelling initially started roughly 3 days prior. Upon assessment. Left calf appears larger than right calf. Area of discoloration (red) noted to the inner upper portion of pt's left lower leg. Pt reports the pain as a throbbing pain.

## 2018-12-28 NOTE — ED Notes (Signed)
Applied ace wrap as temporary compression wrap. Pt educated on availability, proper fit and wear of compression stockings as recommended by EDP. Pt verbalized understanding.

## 2018-12-28 NOTE — ED Provider Notes (Signed)
[facility] Provider Note   ____________________________________________   First MD Initiated Contact with Patient 12/28/18 1228     (approximate)  I have reviewed the triage vital signs and the nursing notes.   HISTORY  Chief Complaint Leg Swelling    HPI Ruben [name] is a 48 y.o. male patient  patient presents with 3 days of left lower leg swelling and pain.  Patient described the pain is "throbbing".  Patient denies chest pain or dyspnea.  Patient has a history of phlebitis.  Last episode was greater than 5 years ago.  Patient rates pain as a 5/10 which increases to a 7/10 with standing and ambulation.  No palliative measures for complaint.   Past Medical History:  Diagnosis Date  . Anxiety   . Depression   . Influenza A     Patient Active Problem List   Diagnosis Date Noted  . Influenza A 12/03/2016  . Hypotension 12/03/2016  . Tachycardia 12/03/2016  . Fever 12/03/2016    Past Surgical History:  Procedure Laterality Date  . CHOLECYSTECTOMY    . FRACTURE SURGERY    . TONSILLECTOMY      Prior to Admission medications   Medication Sig Start Date End Date Taking? Authorizing Provider  albuterol (PROVENTIL HFA;VENTOLIN HFA) 108 (90 Base) MCG/ACT inhaler Inhale 2 puffs into the lungs every 6 (six) hours as needed for wheezing or shortness of breath. Patient not taking: Reported on 12/03/2016 02/15/16   Kem Boroughsriplett, Cari B, FNP  azithromycin (ZITHROMAX) 250 MG tablet Take 1 tablet (250 mg total) by mouth daily. 12/02/16   Minna AntisPaduchowski, Kevin, MD  clindamycin (CLEOCIN) 300 MG capsule Take 1 capsule (300 mg total) by mouth 4 (four) times daily. 06/22/17   Cuthriell, Delorise RoyalsJonathan D, PA-C  guaiFENesin-codeine 100-10 MG/5ML syrup Take 5 mLs by mouth every 6 (six) hours as needed for cough. 12/02/16   Minna AntisPaduchowski, Kevin, MD  ibuprofen (ADVIL,MOTRIN) 600 MG tablet Take 1 tablet (600 mg total) by mouth 3 (three) times daily. With food  01/15/17   Rockne MenghiniNorman, Anne-Caroline, MD  ketorolac (TORADOL) 10 MG tablet Take 1 tablet (10 mg total) by mouth every 6 (six) hours as needed for severe pain. 12/05/16   [name], Srikar, MD  oseltamivir (TAMIFLU) 75 MG capsule Take 1 capsule (75 mg total) by mouth 2 (two) times daily. 12/04/16   [name], Srikar, MD  oxyCODONE-acetaminophen (PERCOCET) 7.5-325 MG tablet Take 1 tablet by mouth every 6 (six) hours as needed. 12/28/18   Joni ReiningSmith, Jeniece Hannis K, PA-C    Allergies Patient has no known allergies.  Family History  Problem Relation Age of Onset  . Diabetes Mother   . Diabetes Father     Social History Social History   Tobacco Use  . Smoking status: Current Every Day Smoker    Packs/day: 0.75    Types: Cigarettes  . Smokeless tobacco: Never Used  Substance Use Topics  . Alcohol use: No    Comment: quit drinking and using illegal drugs in 2013  . Drug use: No    Review of Systems  Constitutional: No fever/chills Eyes: No visual changes. ENT: No sore throat. Cardiovascular: Denies chest pain. Respiratory: Denies shortness of breath. Gastrointestinal: No abdominal pain.  No nausea, no vomiting.  No diarrhea.  No constipation. Genitourinary: Negative for dysuria. Musculoskeletal: Left leg pain.   Skin: Negative for rash. Neurological: Negative for headaches, focal weakness or numbness. Psychiatric:  Anxiety/ depression. ____________________________________________   PHYSICAL EXAM:  VITAL SIGNS:  ED Triage Vitals [12/28/18 1128]  Enc Vitals Group     BP 120/75     Pulse Rate 78     Resp 17     Temp (!) 97.5 F (36.4 C)     Temp Source Oral     SpO2 100 %     Weight 252 lb (114.3 kg)     Height 6\' 2"  (1.88 m)     Head Circumference      Peak Flow      Pain Score      Pain Loc      Pain Edu?      Excl. in GC?     Constitutional: Alert and oriented. Well appearing and in no acute distress. Cardiovascular: Normal rate, regular rhythm. Grossly normal heart sounds.  Good  peripheral circulation. Respiratory: Normal respiratory effort.  No retractions. Lungs CTAB. Musculoskeletal: Mild edema and guarding with palpation mid calf.   Neurologic:  Normal speech and language. No gross focal neurologic deficits are appreciated. No gait instability. Skin:  Skin is warm, dry and intact. No rash noted. Psychiatric: Mood and affect are normal. Speech and behavior are normal.  ____________________________________________   LABS (all labs ordered are listed, but only abnormal results are displayed)  Labs Reviewed  COMPREHENSIVE METABOLIC PANEL - Abnormal; Notable for the following components:      Result Value   Glucose, Bld 164 (*)    Calcium 8.0 (*)    All other components within normal limits  CBC WITH DIFFERENTIAL/PLATELET   ____________________________________________  EKG   ____________________________________________  RADIOLOGY  ED MD interpretation:    Official radiology report(s): US Venous Img Lower Unilateral Left  Result Date: 12/28/2018 CLINICAL DATA:  Left lower extremity pain and edema. History of smoking. Evaluate for DVT. EXAM: LEFT LOWER EXTREMITY VENOUS DOPPLER ULTRASOUND TECHNIQUE: Gray-scale sonography with graded compression, as well as color Doppler and duplex ultrasound were performed to evaluate the lower extremity deep venous systems from the level of the common femoral vein and including the common femoral, femoral, profunda femoral, popliteal and calf veins including the posterior tibial, peroneal and gastrocnemius veins when visible. The superficial great saphenous vein was also interrogated. Spectral Doppler was utilized to evaluate flow at rest and with distal augmentation maneuvers in the common femoral, femoral and popliteal veins. COMPARISON:  Left lower extremity Doppler ultrasound-01/15/2017 FINDINGS: Contralateral Common Femoral Vein: Respiratory phasicity is normal and symmetric with the symptomatic side. No evidence of  thrombus. Normal compressibility. Common Femoral Vein: No evidence of thrombus. Normal compressibility, respiratory phasicity and response to augmentation. Saphenofemoral Junction: No evidence of thrombus. Normal compressibility and flow on color Doppler imaging. Profunda Femoral Vein: No evidence of thrombus. Normal compressibility and flow on color Doppler imaging. Femoral Vein: No evidence of thrombus. Normal compressibility, respiratory phasicity and response to augmentation. Popliteal Vein: No evidence of thrombus. Normal compressibility, respiratory phasicity and response to augmentation. Calf Veins: No evidence of thrombus. Normal compressibility and flow on color Doppler imaging. Superficial Great Saphenous Vein: No evidence of thrombus. Normal compressibility. Venous Reflux:  None. Other Findings: Mixed echogenic nonocclusive thrombus within several hypertrophied varicosities involving the superior anterior aspect the left lower leg/shin. IMPRESSION: 1. No evidence of DVT within left lower extremity. 2. Examination is positive for age-indeterminate nonocclusive superficial thrombophlebitis involving several hypertrophied varicosities involving the superior anterior aspect of the left lower leg/shin. In the absence of a more recent prior examinations, an acute on chronic process is not excluded. Clinical correlation is advised. Again,  there is no extension of this SVT to the deep venous system of the left lower extremity. Electronically Signed   By: Simonne Come M.D.   On: 12/28/2018 12:18    ____________________________________________   PROCEDURES  Procedure(s) performed: None  Procedures  Critical Care performed: No  ____________________________________________   INITIAL IMPRESSION / ASSESSMENT AND PLAN / ED COURSE  As part of my medical decision making, I reviewed the following data within the electronic MEDICAL RECORD NUMBER     Left leg pain secondary to superficial thrombophlebitis.   Discussed ultrasound findings with patient.  Patient given discharge care instructions and advised [facility].  Patient given a work note.      ____________________________________________   FINAL CLINICAL IMPRESSION(S) / ED DIAGNOSES  Final diagnoses:  Thrombophlebitis of superficial veins of left lower extremity     ED Discharge Orders         Ordered    oxyCODONE-acetaminophen (PERCOCET) 7.5-325 MG tablet  Every 6 hours PRN     12/28/18 1248           Note:  This document was prepared using Dragon voice recognition software and may include unintentional dictation errors.    Joni Reining, PA-C 12/28/18 1254    Dionne Bucy, MD 12/28/18 [phone]

## 2018-12-28 NOTE — Discharge Instructions (Signed)
The ultrasound was negative for DVT.  Follow discharge care instruction.  Purchase over-the-counter compression stockings.

## 2019-01-06 ENCOUNTER — Emergency Department: Payer: Self-pay

## 2019-01-06 ENCOUNTER — Emergency Department
Admission: EM | Admit: 2019-01-06 | Discharge: 2019-01-06 | Disposition: A | Discharge status: Home / Self Care | Payer: Self-pay | Attending: Emergency Medicine | Admitting: Emergency Medicine

## 2019-01-06 ENCOUNTER — Other Ambulatory Visit: Payer: Self-pay

## 2019-01-06 DIAGNOSIS — Z79899 Other long term (current) drug therapy: Secondary | ICD-10-CM | POA: Insufficient documentation

## 2019-01-06 DIAGNOSIS — F17210 Nicotine dependence, cigarettes, uncomplicated: Secondary | ICD-10-CM | POA: Insufficient documentation

## 2019-01-06 DIAGNOSIS — I8002 Phlebitis and thrombophlebitis of superficial vessels of left lower extremity: Secondary | ICD-10-CM | POA: Insufficient documentation

## 2019-01-06 DIAGNOSIS — I809 Phlebitis and thrombophlebitis of unspecified site: Secondary | ICD-10-CM

## 2019-01-06 DIAGNOSIS — Z7982 Long term (current) use of aspirin: Secondary | ICD-10-CM | POA: Insufficient documentation

## 2019-01-06 MED ORDER — ASPIRIN EC 81 MG PO TBEC: 81.0000 mg | DELAYED_RELEASE_TABLET | Freq: Every day | ORAL | 1 refills | Status: AC

## 2019-01-06 NOTE — ED Provider Notes (Signed)
[facility] Emergency Department Provider Note  ____________________________________________  Time seen: Approximately 6:33 PM  I have reviewed the triage vital signs and the nursing notes.   HISTORY  Chief Complaint Leg Pain    HPI Ruben [name] is a 48 y.o. male presents to the emergency department with worsening left lower leg pain.  Patient was seen and evaluated 9 days ago and underwent a left lower extremity venous ultrasound which showed findings consistent with thrombophlebitis.  Patient has not been wearing compression stockings as directed but has been keeping left lower extremity elevated and has been taking anti-inflammatories in addition to the Percocet she was prescribed.  Patient reports that his pain is now worsening and he has become concerned.  He has not noticed any worsening erythema.  No pleuritic chest pain or shortness of breath.  Patient denies cough.  No other alleviating measures have been attempted.   Past Medical History:  Diagnosis Date  . Anxiety   . Depression   . Influenza A     Patient Active Problem List   Diagnosis Date Noted  . Influenza A 12/03/2016  . Hypotension 12/03/2016  . Tachycardia 12/03/2016  . Fever 12/03/2016    Past Surgical History:  Procedure Laterality Date  . CHOLECYSTECTOMY    . FRACTURE SURGERY    . TONSILLECTOMY      Prior to Admission medications   Medication Sig Start Date End Date Taking? Authorizing Provider  albuterol (PROVENTIL HFA;VENTOLIN HFA) 108 (90 Base) MCG/ACT inhaler Inhale 2 puffs into the lungs every 6 (six) hours as needed for wheezing or shortness of breath. Patient not taking: Reported on 12/03/2016 02/15/16   Kem Boroughs B, FNP  aspirin EC 81 MG tablet Take 1 tablet (81 mg total) by mouth daily for 30 days. 01/06/19 02/05/19  Orvil Feil, PA-C  azithromycin (ZITHROMAX) 250 MG tablet Take 1 tablet (250 mg total) by mouth daily. 12/02/16   Minna Antis, MD   clindamycin (CLEOCIN) 300 MG capsule Take 1 capsule (300 mg total) by mouth 4 (four) times daily. 06/22/17   Cuthriell, Delorise Royals, PA-C  guaiFENesin-codeine 100-10 MG/5ML syrup Take 5 mLs by mouth every 6 (six) hours as needed for cough. 12/02/16   Minna Antis, MD  ibuprofen (ADVIL,MOTRIN) 600 MG tablet Take 1 tablet (600 mg total) by mouth 3 (three) times daily. With food 01/15/17   Rockne Menghini, MD  ketorolac (TORADOL) 10 MG tablet Take 1 tablet (10 mg total) by mouth every 6 (six) hours as needed for severe pain. 12/05/16   Milagros Loll, MD  oseltamivir (TAMIFLU) 75 MG capsule Take 1 capsule (75 mg total) by mouth 2 (two) times daily. 12/04/16   Milagros Loll, MD  oxyCODONE-acetaminophen (PERCOCET) 7.5-325 MG tablet Take 1 tablet by mouth every 6 (six) hours as needed. 12/28/18   Joni Reining, PA-C    Allergies Patient has no known allergies.  Family History  Problem Relation Age of Onset  . Diabetes Mother   . Diabetes Father     Social History Social History   Tobacco Use  . Smoking status: Current Every Day Smoker    Packs/day: 0.75    Types: Cigarettes  . Smokeless tobacco: Never Used  Substance Use Topics  . Alcohol use: No    Comment: quit drinking and using illegal drugs in 2013  . Drug use: No     Review of Systems  Constitutional: No fever/chills Eyes: No visual changes. No discharge ENT: No upper  respiratory complaints. Cardiovascular: no chest pain. Respiratory: no cough. No SOB. Gastrointestinal: No abdominal pain.  No nausea, no vomiting.  No diarrhea.  No constipation. Genitourinary: Negative for dysuria. No hematuria Musculoskeletal: Negative for musculoskeletal pain. Vascular: Patient has calf pain.  Skin: Negative for rash, abrasions, lacerations, ecchymosis. Neurological: Negative for headaches, focal weakness or numbness.   ____________________________________________   PHYSICAL EXAM:  VITAL SIGNS: ED Triage Vitals  Enc  Vitals Group     BP 01/06/19 1754 117/69     Pulse Rate 01/06/19 1754 84     Resp 01/06/19 1754 17     Temp 01/06/19 1754 98.3 F (36.8 C)     Temp Source 01/06/19 1754 Oral     SpO2 01/06/19 1754 96 %     Weight 01/06/19 1755 251 lb 5.2 oz (114 kg)     Height 01/06/19 1755 6\' 2"  (1.88 m)     Head Circumference --      Peak Flow --      Pain Score 01/06/19 1754 8     Pain Loc --      Pain Edu? --      Excl. in GC? --      Constitutional: Alert and oriented. Well appearing and in no acute distress. Eyes: Conjunctivae are normal. PERRL. EOMI. Head: Atraumatic. Cardiovascular: Normal rate, regular rhythm. Normal S1 and S2.  Good peripheral circulation. Respiratory: Normal respiratory effort without tachypnea or retractions. Lungs CTAB. Good air entry to the bases with no decreased or absent breath sounds. Gastrointestinal: Bowel sounds 4 quadrants. Soft and nontender to palpation. No guarding or rigidity. No palpable masses. No distention. No CVA tenderness. Musculoskeletal: Full range of motion to all extremities. No gross deformities appreciated.  Patient has left-sided calf pain to palpation.  Palpable dorsalis pedis pulse bilaterally and symmetrically. Neurologic:  Normal speech and language. No gross focal neurologic deficits are appreciated.  Skin:  Skin is warm, dry and intact. No rash noted. Psychiatric: Mood and affect are normal. Speech and behavior are normal. Patient exhibits appropriate insight and judgement.   ____________________________________________   LABS (all labs ordered are listed, but only abnormal results are displayed)  Labs Reviewed - No data to display ____________________________________________  EKG   ____________________________________________  RADIOLOGY I personally viewed and evaluated these images as part of my medical decision making, as well as reviewing the written report by the radiologist.    [place] Venous Img Lower Unilateral  Left  Result Date: 01/06/2019 CLINICAL DATA:  Worsening pain. Previous diagnosis of thrombophlebitis. EXAM: LEFT LOWER EXTREMITY VENOUS DOPPLER ULTRASOUND TECHNIQUE: Gray-scale sonography with graded compression, as well as color Doppler and duplex ultrasound were performed to evaluate the lower extremity deep venous systems from the level of the common femoral vein and including the common femoral, femoral, profunda femoral, popliteal and calf veins including the posterior tibial, peroneal and gastrocnemius veins when visible. The superficial great saphenous vein was also interrogated. Spectral Doppler was utilized to evaluate flow at rest and with distal augmentation maneuvers in the common femoral, femoral and popliteal veins. COMPARISON:  12/28/2018 FINDINGS: Contralateral Common Femoral Vein: Respiratory phasicity is normal and symmetric with the symptomatic side. No evidence of thrombus. Normal compressibility. Common Femoral Vein: No evidence of thrombus. Normal compressibility, respiratory phasicity and response to augmentation. Saphenofemoral Junction: No evidence of thrombus. Normal compressibility and flow on color Doppler imaging. Profunda Femoral Vein: No evidence of thrombus. Normal compressibility and flow on color Doppler imaging. Femoral Vein: No evidence of thrombus.  Normal compressibility, respiratory phasicity and response to augmentation. Popliteal Vein: No evidence of thrombus. Normal compressibility, respiratory phasicity and response to augmentation. Calf Veins: No evidence of thrombus. Normal compressibility and flow on color Doppler imaging. Superficial Great Saphenous Vein: No evidence of thrombus. Normal compressibility. Venous Reflux:  None. 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 This is unchanged since prior study. IMPRESSION: No evidence of deep venous thrombosis. Superficial thrombophlebitis within  varicosities in the medial left calf. No change since recent study. Electronically Signed   By: Charlett Nose M.D.   On: 01/06/2019 19:16    ____________________________________________    PROCEDURES  Procedure(s) performed:    Procedures    Medications - No data to display   ____________________________________________   INITIAL IMPRESSION / ASSESSMENT AND PLAN / ED COURSE  Pertinent labs & imaging results that were available during my care of the patient were reviewed by me and considered in my medical decision making (see chart for details).  Review of the  [place] was performed in accordance of the [facility] prior to dispensing any controlled drugs.      Assessment and Plan:  Thrombophlebitis Patient presents to the emergency department with persistent left calf pain since being diagnosed with thrombophlebitis 9 days ago.  Patient also reports that he is out of his Percocet.  I declined a refill of Percocet during this emergency department encounter.  Venous ultrasound was repeated in the emergency department which revealed no evidence of thromboembolism.  I added on aspirin to patient's treatment plan.  I advised wearing compression stockings at home and maintaining elevation when possible.  Patient was advised to follow-up with primary care as needed.  All patient questions were answered.    ____________________________________________  FINAL CLINICAL IMPRESSION(S) / ED DIAGNOSES  Final diagnoses:  Thrombophlebitis      NEW MEDICATIONS STARTED DURING THIS VISIT:  ED Discharge Orders         Ordered    aspirin EC 81 MG tablet  Daily     01/06/19 1949              This chart was dictated using voice recognition software/Dragon. Despite best efforts to proofread, errors can occur which can change the meaning. Any change was purely unintentional.    Orvil Feil, PA-C 01/06/19 2201    Minna Antis, MD 01/07/19 [phone]

## 2019-01-06 NOTE — ED Triage Notes (Signed)
Pt states he was seen here and dx with phlebitis of the varicose veins in his LLE on 2/14, states he has been wrapping it with an ace wrap as instructed and it is not getting any better. No noted redness or other sx. Pt has minor child with him.

## 2019-01-06 NOTE — ED Notes (Signed)
Pt signed esignature.  D/c  inst to pt.  

## 2019-01-15 ENCOUNTER — Emergency Department
Admission: EM | Admit: 2019-01-15 | Discharge: 2019-01-15 | Disposition: A | Discharge status: Home / Self Care | Payer: Self-pay | Attending: Emergency Medicine | Admitting: Emergency Medicine

## 2019-01-15 ENCOUNTER — Other Ambulatory Visit: Payer: Self-pay

## 2019-01-15 ENCOUNTER — Encounter: Payer: Self-pay | Admitting: Emergency Medicine

## 2019-01-15 DIAGNOSIS — F17210 Nicotine dependence, cigarettes, uncomplicated: Secondary | ICD-10-CM | POA: Insufficient documentation

## 2019-01-15 DIAGNOSIS — K529 Noninfective gastroenteritis and colitis, unspecified: Secondary | ICD-10-CM | POA: Insufficient documentation

## 2019-01-15 LAB — COMPREHENSIVE METABOLIC PANEL
ALBUMIN: 3.9 g/dL (ref 3.5–5.0)
ALT: 17 U/L (ref 0–44)
ANION GAP: 12 (ref 5–15)
AST: 17 U/L (ref 15–41)
Alkaline Phosphatase: 56 U/L (ref 38–126)
BUN: 14 mg/dL (ref 6–20)
CO2: 20 mmol/L — AB (ref 22–32)
Calcium: 8.4 mg/dL — ABNORMAL LOW (ref 8.9–10.3)
Chloride: 103 mmol/L (ref 98–111)
Creatinine, Ser: 0.97 mg/dL (ref 0.61–1.24)
GFR calc non Af Amer: >60 mL/min (ref >60)
Glucose, Bld: 128 mg/dL — ABNORMAL HIGH (ref 70–99)
POTASSIUM: 3.9 mmol/L (ref 3.5–5.1)
SODIUM: 135 mmol/L (ref 135–145)
Total Bilirubin: 1 mg/dL (ref 0.3–1.2)
Total Protein: 6.9 g/dL (ref 6.5–8.1)

## 2019-01-15 LAB — URINALYSIS, COMPLETE (UACMP) WITH MICROSCOPIC
Bacteria, UA: NONE SEEN
Bilirubin Urine: NEGATIVE
Glucose, UA: NEGATIVE mg/dL
Hgb urine dipstick: NEGATIVE
Ketones, ur: NEGATIVE mg/dL
Leukocytes,Ua: NEGATIVE
Nitrite: NEGATIVE
PH: 5 (ref 5.0–8.0)
Protein, ur: NEGATIVE mg/dL
SPECIFIC GRAVITY, URINE: 1.026 (ref 1.005–1.030)

## 2019-01-15 LAB — CBC
HCT: 49 % (ref 39.0–52.0)
HEMOGLOBIN: 16.4 g/dL (ref 13.0–17.0)
MCH: 31.2 pg (ref 26.0–34.0)
MCHC: 33.5 g/dL (ref 30.0–36.0)
MCV: 93.3 fL (ref 80.0–100.0)
Platelets: 256 10*3/uL (ref 150–400)
RBC: 5.25 MIL/uL (ref 4.22–5.81)
RDW: 12.5 % (ref 11.5–15.5)
WBC: 11.4 10*3/uL — ABNORMAL HIGH (ref 4.0–10.5)
nRBC: 0 % (ref 0.0–0.2)

## 2019-01-15 LAB — LIPASE, BLOOD: LIPASE: 26 U/L (ref 11–51)

## 2019-01-15 MED ORDER — ONDANSETRON 4 MG PO TBDP: 4.0000 mg | ORAL_TABLET | Freq: Once | ORAL | Status: DC | PRN

## 2019-01-15 MED ORDER — SODIUM CHLORIDE 0.9 % IV BOLUS: 1000.0000 mL | Freq: Once | INTRAVENOUS | Status: DC

## 2019-01-15 MED ORDER — ONDANSETRON HCL 4 MG PO TABS: 4.0000 mg | ORAL_TABLET | Freq: Three times a day (TID) | ORAL | 0 refills | Status: DC | PRN

## 2019-01-15 MED ORDER — SODIUM CHLORIDE 0.9 % IV BOLUS
1000.0000 mL | Freq: Once | INTRAVENOUS | Status: AC
  Administered 2019-01-15: 1000 mL via INTRAVENOUS

## 2019-01-15 NOTE — ED Triage Notes (Signed)
NVD since early this AM.  Pt reports cannot keep water down. Started with lower abdominal cramps prior to vomiting and diarrhea. More diarrhea than vomiting. Reports 30 episodes of diarrhea. Informed pt to let RN know if going to have another bowel movement, will see if need to send sample. No abx use.  No fevers. Pt reports temp 99.2. VSS.

## 2019-01-15 NOTE — ED Provider Notes (Addendum)
[facility] Emergency Department Provider Note  ____________________________________________   I have reviewed the triage vital signs and the nursing notes. Where available I have reviewed prior notes and, if possible and indicated, outside hospital notes.    HISTORY  Chief Complaint Emesis    HPI Ruben [name] is a 48 y.o. male  Presents today complaining of vomiting and diarrhea.  Mostly diarrhea.  He is been to multiple ERs for other reasons over the last couple weeks, and thinks he picked up a bug in the waiting room.  He was near people who were having the same symptoms.  He did not have any fever or chills he has no focal abdominal pain.  He states that he has had multiple episodes of diarrhea and it is nonbloody with no melena.  No recent travel no recent antibiotics.  He states he is feeling somewhat better started this morning around 1:00 in the morning has been persistent since that time.  Past Medical History:  Diagnosis Date  . Anxiety   . Depression   . Influenza A     Patient Active Problem List   Diagnosis Date Noted  . Influenza A 12/03/2016  . Hypotension 12/03/2016  . Tachycardia 12/03/2016  . Fever 12/03/2016    Past Surgical History:  Procedure Laterality Date  . CHOLECYSTECTOMY    . FRACTURE SURGERY    . TONSILLECTOMY      Prior to Admission medications   Medication Sig Start Date End Date Taking? Authorizing Provider  albuterol (PROVENTIL HFA;VENTOLIN HFA) 108 (90 Base) MCG/ACT inhaler Inhale 2 puffs into the lungs every 6 (six) hours as needed for wheezing or shortness of breath. Patient not taking: Reported on 12/03/2016 02/15/16   Kem Boroughs B, FNP  aspirin EC 81 MG tablet Take 1 tablet (81 mg total) by mouth daily for 30 days. 01/06/19 02/05/19  Orvil Feil, PA-C  azithromycin (ZITHROMAX) 250 MG tablet Take 1 tablet (250 mg total) by mouth daily. 12/02/16   Minna Antis, MD  clindamycin (CLEOCIN) 300 MG  capsule Take 1 capsule (300 mg total) by mouth 4 (four) times daily. 06/22/17   Cuthriell, Delorise Royals, PA-C  guaiFENesin-codeine 100-10 MG/5ML syrup Take 5 mLs by mouth every 6 (six) hours as needed for cough. 12/02/16   Minna Antis, MD  ibuprofen (ADVIL,MOTRIN) 600 MG tablet Take 1 tablet (600 mg total) by mouth 3 (three) times daily. With food 01/15/17   Rockne Menghini, MD  ketorolac (TORADOL) 10 MG tablet Take 1 tablet (10 mg total) by mouth every 6 (six) hours as needed for severe pain. 12/05/16   Milagros Loll, MD  oseltamivir (TAMIFLU) 75 MG capsule Take 1 capsule (75 mg total) by mouth 2 (two) times daily. 12/04/16   Milagros Loll, MD  oxyCODONE-acetaminophen (PERCOCET) 7.5-325 MG tablet Take 1 tablet by mouth every 6 (six) hours as needed. 12/28/18   Joni Reining, PA-C    Allergies Patient has no known allergies.  Family History  Problem Relation Age of Onset  . Diabetes Mother   . Diabetes Father     Social History Social History   Tobacco Use  . Smoking status: Current Every Day Smoker    Packs/day: 0.75    Types: Cigarettes  . Smokeless tobacco: Never Used  Substance Use Topics  . Alcohol use: No    Comment: quit drinking and using illegal drugs in 2013  . Drug use: No    Review of Systems Constitutional: No fever/chills  Eyes: No visual changes. ENT: No sore throat. No stiff neck no neck pain Cardiovascular: Denies chest pain. Respiratory: Denies shortness of breath. Gastrointestinal:   no vomiting.  No diarrhea.  No constipation. Genitourinary: Negative for dysuria. Musculoskeletal: Negative lower extremity swelling Skin: Negative for rash. Neurological: Negative for severe headaches, focal weakness or numbness.   ____________________________________________   PHYSICAL EXAM:  VITAL SIGNS: ED Triage Vitals  Enc Vitals Group     BP 01/15/19 1434 116/77     Pulse Rate 01/15/19 1434 100     Resp 01/15/19 1434 17     Temp 01/15/19 1434 99 F  (37.2 C)     Temp Source 01/15/19 1434 Oral     SpO2 01/15/19 1434 96 %     Weight 01/15/19 1435 252 lb (114.3 kg)     Height 01/15/19 1435 6\' 2"  (1.88 m)     Head Circumference --      Peak Flow --      Pain Score 01/15/19 1435 8     Pain Loc --      Pain Edu? --      Excl. in GC? --     Constitutional: Alert and oriented. Well appearing and in no acute distress. Eyes: Conjunctivae are normal Head: Atraumatic HEENT: No congestion/rhinnorhea. Mucous membranes are moist.  Oropharynx non-erythematous Neck:   Nontender with no meningismus, no masses, no stridor Cardiovascular: Normal rate, regular rhythm. Grossly normal heart sounds.  Good peripheral circulation. Respiratory: Normal respiratory effort.  No retractions. Lungs CTAB. Abdominal: Soft and nontender. No distention. No guarding no rebound Back:  There is no focal tenderness or step off.  there is no midline tenderness there are no lesions noted. there is no CVA tenderness Musculoskeletal: No lower extremity tenderness, no upper extremity tenderness. No joint effusions, no DVT signs strong distal pulses no edema Neurologic:  Normal speech and language. No gross focal neurologic deficits are appreciated.  Skin:  Skin is warm, dry and intact. No rash noted. Psychiatric: Mood and affect are normal. Speech and behavior are normal.  ____________________________________________   LABS (all labs ordered are listed, but only abnormal results are displayed)  Labs Reviewed  COMPREHENSIVE METABOLIC PANEL - Abnormal; Notable for the following components:      Result Value   CO2 20 (*)    Glucose, Bld 128 (*)    Calcium 8.4 (*)    All other components within normal limits  CBC - Abnormal; Notable for the following components:   WBC 11.4 (*)    All other components within normal limits  URINALYSIS, COMPLETE (UACMP) WITH MICROSCOPIC - Abnormal; Notable for the following components:   Color, Urine YELLOW (*)    APPearance CLEAR (*)     All other components within normal limits  GASTROINTESTINAL PANEL BY PCR, STOOL (REPLACES STOOL CULTURE)  LIPASE, BLOOD    Pertinent labs  results that were available during my care of the patient were reviewed by me and considered in my medical decision making (see chart for details). ____________________________________________  EKG  I personally interpreted any EKGs ordered by me or triage  ____________________________________________  RADIOLOGY  Pertinent labs & imaging results that were available during my care of the patient were reviewed by me and considered in my medical decision making (see chart for details). If possible, patient and/or family made aware of any abnormal findings.  No results found. ____________________________________________    PROCEDURES  Procedure(s) performed: None  Procedures  Critical Care performed: None  ____________________________________________   INITIAL IMPRESSION / ASSESSMENT AND PLAN / ED COURSE  Pertinent labs & imaging results that were available during my care of the patient were reviewed by me and considered in my medical decision making (see chart for details).  Well-appearing gentleman with likely viral gastroenteritis, it is benign, and he is well-appearing.   ----------------------------------------- 7:19 PM on 01/15/2019 -----------------------------------------  Patient remains in no acute distress has not had any active vomiting since has been here has not had any diarrhea has not been able to give [place] a stool sample has been here for several hours, we did give him IV fluid bolus he is tolerating p.o. and requesting discharge.  His abdomen on serial exams remains benign.  Considering the patient's symptoms, medical history, and physical examination today, I have low suspicion for cholecystitis or biliary pathology, pancreatitis, perforation or bowel obstruction, hernia, intra-abdominal abscess, AAA or dissection,  volvulus or intussusception, mesenteric ischemia, ischemic gut, pyelonephritis or appendicitis.   ____________________________________________   FINAL CLINICAL IMPRESSION(S) / ED DIAGNOSES  Final diagnoses:  None      This chart was dictated using voice recognition software.  Despite best efforts to proofread,  errors can occur which can change meaning.      Jeanmarie Plant, MD 01/15/19 1843    Jeanmarie Plant, MD 01/15/19 1919

## 2019-01-15 NOTE — ED Notes (Signed)
PO Challenge: pt was able to eat 2 crackers and 4oz of water. Denies N/V.

## 2019-01-29 ENCOUNTER — Emergency Department
Admission: EM | Admit: 2019-01-29 | Discharge: 2019-01-29 | Disposition: A | Discharge status: Home / Self Care | Payer: Self-pay | Attending: Emergency Medicine | Admitting: Emergency Medicine

## 2019-01-29 ENCOUNTER — Other Ambulatory Visit: Payer: Self-pay

## 2019-01-29 DIAGNOSIS — R509 Fever, unspecified: Secondary | ICD-10-CM | POA: Insufficient documentation

## 2019-01-29 DIAGNOSIS — Z7982 Long term (current) use of aspirin: Secondary | ICD-10-CM | POA: Insufficient documentation

## 2019-01-29 DIAGNOSIS — F17210 Nicotine dependence, cigarettes, uncomplicated: Secondary | ICD-10-CM | POA: Insufficient documentation

## 2019-01-29 DIAGNOSIS — Z79899 Other long term (current) drug therapy: Secondary | ICD-10-CM | POA: Insufficient documentation

## 2019-01-29 DIAGNOSIS — J111 Influenza due to unidentified influenza virus with other respiratory manifestations: Secondary | ICD-10-CM | POA: Insufficient documentation

## 2019-01-29 DIAGNOSIS — R05 Cough: Secondary | ICD-10-CM | POA: Insufficient documentation

## 2019-01-29 DIAGNOSIS — R0981 Nasal congestion: Secondary | ICD-10-CM | POA: Insufficient documentation

## 2019-01-29 DIAGNOSIS — R070 Pain in throat: Secondary | ICD-10-CM | POA: Insufficient documentation

## 2019-01-29 LAB — INFLUENZA PANEL BY PCR (TYPE A & B)
INFLAPCR: NEGATIVE
Influenza B By PCR: NEGATIVE

## 2019-01-29 MED ORDER — ACETAMINOPHEN 325 MG PO TABS
ORAL_TABLET | ORAL | Status: AC
  Filled 2019-01-29: qty 2

## 2019-01-29 MED ORDER — FLUTICASONE PROPIONATE 50 MCG/ACT NA SUSP: 1.0000 | Freq: Two times a day (BID) | NASAL | 0 refills | Status: DC

## 2019-01-29 MED ORDER — PSEUDOEPH-BROMPHEN-DM 30-2-10 MG/5ML PO SYRP: 10.0000 mL | ORAL_SOLUTION | Freq: Four times a day (QID) | ORAL | 0 refills | Status: DC | PRN

## 2019-01-29 MED ORDER — ACETAMINOPHEN 325 MG PO TABS
650.0000 mg | ORAL_TABLET | Freq: Once | ORAL | Status: AC | PRN
  Administered 2019-01-29: 650 mg via ORAL

## 2019-01-29 NOTE — ED Triage Notes (Addendum)
Pt states he began to have chills, body aches, sore throat and fever today. Pt masked in triage. Pt states fever of 101.6 today. Pt has not taken any meds today.

## 2019-01-29 NOTE — ED Notes (Signed)
Patient states having chills all day and running fever. Body aches, having nausea and has took Zofran 4mg  x2 today. All symptoms started today.

## 2019-01-29 NOTE — ED Provider Notes (Signed)
[facility] Emergency Department Provider Note  ____________________________________________  Time seen: Approximately 8:55 PM  I have reviewed the triage vital signs and the nursing notes.   HISTORY  Chief Complaint Fever and Generalized Body Aches    HPI Ruben [name] is a 48 y.o. male who presents the emergency department complaining of sudden onset of headache, fever and chills, nasal congestion, sore throat, cough.  Patient reports that he has had 102 degree fever today.  Sudden onset of symptoms began today.  He denies any neck pain or stiffness, chest pain, shortness of breath, abdominal pain, nausea vomiting, diarrhea or constipation.  No recent travel.  No indication of close contact with anybody with coronavirus.  Other than fluids, no medication prior to arrival.  Patient was given Tylenol in the emergency department.         Past Medical History:  Diagnosis Date  . Anxiety   . Depression   . Influenza A     Patient Active Problem List   Diagnosis Date Noted  . Influenza A 12/03/2016  . Hypotension 12/03/2016  . Tachycardia 12/03/2016  . Fever 12/03/2016    Past Surgical History:  Procedure Laterality Date  . CHOLECYSTECTOMY    . FRACTURE SURGERY    . TONSILLECTOMY      Prior to Admission medications   Medication Sig Start Date End Date Taking? Authorizing Provider  albuterol (PROVENTIL HFA;VENTOLIN HFA) 108 (90 Base) MCG/ACT inhaler Inhale 2 puffs into the lungs every 6 (six) hours as needed for wheezing or shortness of breath. Patient not taking: Reported on 12/03/2016 02/15/16   Kem Boroughs B, FNP  aspirin EC 81 MG tablet Take 1 tablet (81 mg total) by mouth daily for 30 days. 01/06/19 02/05/19  Orvil Feil, PA-C  azithromycin (ZITHROMAX) 250 MG tablet Take 1 tablet (250 mg total) by mouth daily. 12/02/16   Minna Antis, MD  brompheniramine-pseudoephedrine-DM 30-2-10 MG/5ML syrup Take 10 mLs by mouth 4 (four) times  daily as needed. 01/29/19   Kolt Mcwhirter, Delorise Royals, PA-C  clindamycin (CLEOCIN) 300 MG capsule Take 1 capsule (300 mg total) by mouth 4 (four) times daily. 06/22/17   Christos Mixson, Delorise Royals, PA-C  fluticasone (FLONASE) 50 MCG/ACT nasal spray Place 1 spray into both nostrils 2 (two) times daily. 01/29/19   Camary Sosa, Delorise Royals, PA-C  guaiFENesin-codeine 100-10 MG/5ML syrup Take 5 mLs by mouth every 6 (six) hours as needed for cough. 12/02/16   Minna Antis, MD  ibuprofen (ADVIL,MOTRIN) 600 MG tablet Take 1 tablet (600 mg total) by mouth 3 (three) times daily. With food 01/15/17   Rockne Menghini, MD  ketorolac (TORADOL) 10 MG tablet Take 1 tablet (10 mg total) by mouth every 6 (six) hours as needed for severe pain. 12/05/16   Milagros Loll, MD  ondansetron (ZOFRAN) 4 MG tablet Take 1 tablet (4 mg total) by mouth every 8 (eight) hours as needed for nausea or vomiting. 01/15/19   Jeanmarie Plant, MD  oseltamivir (TAMIFLU) 75 MG capsule Take 1 capsule (75 mg total) by mouth 2 (two) times daily. 12/04/16   Milagros Loll, MD  oxyCODONE-acetaminophen (PERCOCET) 7.5-325 MG tablet Take 1 tablet by mouth every 6 (six) hours as needed. 12/28/18   Joni Reining, PA-C    Allergies Patient has no known allergies.  Family History  Problem Relation Age of Onset  . Diabetes Mother   . Diabetes Father     Social History Social History   Tobacco Use  .  Smoking status: Current Every Day Smoker    Packs/day: 0.75    Types: Cigarettes  . Smokeless tobacco: Never Used  Substance Use Topics  . Alcohol use: No    Comment: quit drinking and using illegal drugs in 2013  . Drug use: No     Review of Systems  Constitutional: Positive fever/chills.  Positive for body aches. Eyes: No visual changes. No discharge ENT: Positive for nasal congestion and sore throat Cardiovascular: no chest pain. Respiratory: Positive cough. No SOB. Gastrointestinal: No abdominal pain.  No nausea, no vomiting.  No  diarrhea.  No constipation. Genitourinary: Negative for dysuria. No hematuria Musculoskeletal: Negative for musculoskeletal pain. Skin: Negative for rash, abrasions, lacerations, ecchymosis. Neurological: Negative for headaches, focal weakness or numbness. 10-point ROS otherwise negative.  ____________________________________________   PHYSICAL EXAM:  VITAL SIGNS: ED Triage Vitals  Enc Vitals Group     BP 01/29/19 2037 109/66     Pulse Rate 01/29/19 2037 100     Resp 01/29/19 2037 18     Temp 01/29/19 2037 (!) 101.3 F (38.5 C)     Temp Source 01/29/19 2037 Oral     SpO2 01/29/19 2037 98 %     Weight 01/29/19 2037 251 lb (113.9 kg)     Height 01/29/19 2037 6\' 2"  (1.88 m)     Head Circumference --      Peak Flow --      Pain Score 01/29/19 2038 7     Pain Loc --      Pain Edu? --      Excl. in GC? --      Constitutional: Alert and oriented. Well appearing and in no acute distress. Eyes: Conjunctivae are normal. PERRL. EOMI. Head: Atraumatic. ENT:      Ears: EACs and TMs unremarkable bilaterally.      Nose: Moderate congestion/rhinnorhea.      Mouth/Throat: Mucous membranes are moist.  Oropharynx is mildly erythematous but nonedematous.  Uvula is midline. Neck: No stridor.  Neck is supple full range of motion Hematological/Lymphatic/Immunilogical: No cervical lymphadenopathy. Cardiovascular: Normal rate, regular rhythm. Normal S1 and S2.  Good peripheral circulation. Respiratory: Normal respiratory effort without tachypnea or retractions. Lungs CTAB. Good air entry to the bases with no decreased or absent breath sounds. Musculoskeletal: Full range of motion to all extremities. No gross deformities appreciated. Neurologic:  Normal speech and language. No gross focal neurologic deficits are appreciated.  Skin:  Skin is warm, dry and intact. No rash noted. Psychiatric: Mood and affect are normal. Speech and behavior are normal. Patient exhibits appropriate insight and  judgement.   ____________________________________________   LABS (all labs ordered are listed, but only abnormal results are displayed)  Labs Reviewed  INFLUENZA PANEL BY PCR (TYPE A & B)   ____________________________________________  EKG   ____________________________________________  RADIOLOGY   No results found.  ____________________________________________    PROCEDURES  Procedure(s) performed:    Procedures    Medications  acetaminophen (TYLENOL) tablet 650 mg (650 mg Oral Given 01/29/19 2042)     ____________________________________________   INITIAL IMPRESSION / ASSESSMENT AND PLAN / ED COURSE  Pertinent labs & imaging results that were available during my care of the patient were reviewed by me and considered in my medical decision making (see chart for details).  Review of the [place] CSRS was performed in accordance of the [facility] prior to dispensing any controlled drugs.           Patient's diagnosis is consistent with influenza.  Patient presents the emergency department with sudden onset of flulike symptoms.  No recent travel or high risk contacts.  Exam was overall reassuring.  Influenza swab is obtained the patient will be discharged home prior to results.  He may call for results tomorrow.  Patient will be prescribed symptom control medications but he declines Tamiflu at this time.  Tylenol and Motrin at home for additional symptom relief.  Follow-up primary care as needed..  Patient is given ED precautions to return to the ED for any worsening or new symptoms.     ____________________________________________  FINAL CLINICAL IMPRESSION(S) / ED DIAGNOSES  Final diagnoses:  Influenza      NEW MEDICATIONS STARTED DURING THIS VISIT:  ED Discharge Orders         Ordered    brompheniramine-pseudoephedrine-DM 30-2-10 MG/5ML syrup  4 times daily PRN     01/29/19 2113    fluticasone (FLONASE) 50 MCG/ACT nasal spray  2 times daily      01/29/19 2113              This chart was dictated using voice recognition software/Dragon. Despite best efforts to proofread, errors can occur which can change the meaning. Any change was purely unintentional.    Lanette Hampshire 01/29/19 2117    Phineas Semen, MD 01/29/19 2144

## 2019-02-02 ENCOUNTER — Emergency Department
Admission: EM | Admit: 2019-02-02 | Discharge: 2019-02-02 | Disposition: A | Discharge status: Home / Self Care | Payer: Self-pay | Attending: Emergency Medicine | Admitting: Emergency Medicine

## 2019-02-02 ENCOUNTER — Other Ambulatory Visit: Payer: Self-pay

## 2019-02-02 ENCOUNTER — Encounter: Payer: Self-pay | Admitting: Emergency Medicine

## 2019-02-02 DIAGNOSIS — F419 Anxiety disorder, unspecified: Secondary | ICD-10-CM | POA: Insufficient documentation

## 2019-02-02 DIAGNOSIS — F17210 Nicotine dependence, cigarettes, uncomplicated: Secondary | ICD-10-CM | POA: Insufficient documentation

## 2019-02-02 DIAGNOSIS — J111 Influenza due to unidentified influenza virus with other respiratory manifestations: Secondary | ICD-10-CM | POA: Insufficient documentation

## 2019-02-02 DIAGNOSIS — Z79899 Other long term (current) drug therapy: Secondary | ICD-10-CM | POA: Insufficient documentation

## 2019-02-02 NOTE — Discharge Instructions (Addendum)
You may return to work as scheduled

## 2019-02-02 NOTE — ED Triage Notes (Signed)
Pt was seen here 4 days ago and dx with flu. Pt reports his work needs a note stating ok for him to return to work.  Pt denies any symptoms at this time.

## 2019-02-02 NOTE — ED Provider Notes (Signed)
[facility] Emergency Department Provider Note  ____________________________________________   First MD Initiated Contact with Patient 02/02/19 1210     (approximate)  I have reviewed the triage vital signs and the nursing notes.   HISTORY  Chief Complaint work note    HPI Ruben [name] is a 48 y.o. male presents to the ED for a work note.  Patient states that his workplace would not allow him to return to work with the note that he was given 4 days ago when he was diagnosed with flu.  Patient denies any fever, chills, nausea or vomiting.  He states that he is been symptom-free for over 48 hours and nearly 72 hours.     Past Medical History:  Diagnosis Date  . Anxiety   . Depression   . Influenza A     Patient Active Problem List   Diagnosis Date Noted  . Influenza A 12/03/2016  . Hypotension 12/03/2016  . Tachycardia 12/03/2016  . Fever 12/03/2016    Past Surgical History:  Procedure Laterality Date  . CHOLECYSTECTOMY    . FRACTURE SURGERY    . TONSILLECTOMY      Prior to Admission medications   Medication Sig Start Date End Date Taking? Authorizing Provider  albuterol (PROVENTIL HFA;VENTOLIN HFA) 108 (90 Base) MCG/ACT inhaler Inhale 2 puffs into the lungs every 6 (six) hours as needed for wheezing or shortness of breath. Patient not taking: Reported on 12/03/2016 02/15/16   Kem Boroughs B, FNP  aspirin EC 81 MG tablet Take 1 tablet (81 mg total) by mouth daily for 30 days. 01/06/19 02/05/19  Orvil Feil, PA-C  azithromycin (ZITHROMAX) 250 MG tablet Take 1 tablet (250 mg total) by mouth daily. 12/02/16   Minna Antis, MD  brompheniramine-pseudoephedrine-DM 30-2-10 MG/5ML syrup Take 10 mLs by mouth 4 (four) times daily as needed. 01/29/19   Cuthriell, Delorise Royals, PA-C  clindamycin (CLEOCIN) 300 MG capsule Take 1 capsule (300 mg total) by mouth 4 (four) times daily. 06/22/17   Cuthriell, Delorise Royals, PA-C  fluticasone (FLONASE) 50  MCG/ACT nasal spray Place 1 spray into both nostrils 2 (two) times daily. 01/29/19   Cuthriell, Delorise Royals, PA-C  guaiFENesin-codeine 100-10 MG/5ML syrup Take 5 mLs by mouth every 6 (six) hours as needed for cough. 12/02/16   Minna Antis, MD  ibuprofen (ADVIL,MOTRIN) 600 MG tablet Take 1 tablet (600 mg total) by mouth 3 (three) times daily. With food 01/15/17   Rockne Menghini, MD  ketorolac (TORADOL) 10 MG tablet Take 1 tablet (10 mg total) by mouth every 6 (six) hours as needed for severe pain. 12/05/16   Milagros Loll, MD  ondansetron (ZOFRAN) 4 MG tablet Take 1 tablet (4 mg total) by mouth every 8 (eight) hours as needed for nausea or vomiting. 01/15/19   Jeanmarie Plant, MD  oseltamivir (TAMIFLU) 75 MG capsule Take 1 capsule (75 mg total) by mouth 2 (two) times daily. 12/04/16   Milagros Loll, MD  oxyCODONE-acetaminophen (PERCOCET) 7.5-325 MG tablet Take 1 tablet by mouth every 6 (six) hours as needed. 12/28/18   Joni Reining, PA-C    Allergies Patient has no known allergies.  Family History  Problem Relation Age of Onset  . Diabetes Mother   . Diabetes Father     Social History Social History   Tobacco Use  . Smoking status: Current Every Day Smoker    Packs/day: 0.75    Types: Cigarettes  . Smokeless tobacco: Never Used  Substance Use Topics  . Alcohol use: No    Comment: quit drinking and using illegal drugs in 2013  . Drug use: No    Review of Systems Constitutional: No fever/chills Eyes: No visual changes. ENT: No sore throat.  Negative for nasal congestion. Cardiovascular: Denies chest pain. Respiratory: Denies shortness of breath.  Negative for cough. Gastrointestinal: No abdominal pain.  No nausea, no vomiting.  No diarrhea.   Genitourinary: Negative for dysuria. Musculoskeletal: Negative for muscle skeletal pain. Skin: Negative for rash. Neurological: Negative for headaches, focal weakness or numbness. ___________________________________________    PHYSICAL EXAM:  VITAL SIGNS: ED Triage Vitals  Enc Vitals Group     BP 02/02/19 1203 (!) 143/92     Pulse Rate 02/02/19 1203 (!) 102     Resp 02/02/19 1203 20     Temp 02/02/19 1203 98.2 F (36.8 C)     Temp Source 02/02/19 1203 Oral     SpO2 02/02/19 1203 97 %     Weight 02/02/19 1159 251 lb (113.9 kg)     Height 02/02/19 1159 6\' 2"  (1.88 m)     Head Circumference --      Peak Flow --      Pain Score 02/02/19 1159 0     Pain Loc --      Pain Edu? --      Excl. in GC? --    Constitutional: Alert and oriented. Well appearing and in no acute distress. Eyes: Conjunctivae are normal.  Head: Atraumatic. Nose: No congestion/rhinnorhea. Mouth/Throat: Mucous membranes are moist.  Oropharynx non-erythematous. Neck: No stridor.   Hematological/Lymphatic/Immunilogical: No cervical lymphadenopathy. Cardiovascular: Normal rate, regular rhythm. Grossly normal heart sounds.  Good peripheral circulation. Respiratory: Normal respiratory effort.  No retractions. Lungs CTAB. Musculoskeletal: Normal gait was noted. Neurologic:  Normal speech and language. No gross focal neurologic deficits are appreciated. Skin:  Skin is warm, dry and intact. No rash noted. Psychiatric: Mood and affect are normal. Speech and behavior are normal.  ____________________________________________   LABS (all labs ordered are listed, but only abnormal results are displayed)  Labs Reviewed - No data to display  PROCEDURES  Procedure(s) performed (including Critical Care):  Procedures   ____________________________________________   INITIAL IMPRESSION / ASSESSMENT AND PLAN / ED COURSE  As part of my medical decision making, I reviewed the following data within the electronic MEDICAL RECORD NUMBER Notes from prior ED visits and [place] Controlled Substance Database  Patient presents to the ED for a work note releasing him to go back to work as they would not except his note written saying that he could go back to  work when he did not have fever or symptoms during a period of time.  Patient was given a note to return to work his next scheduled workday as he is afebrile and asymptomatic in the ED today.  ____________________________________________   FINAL CLINICAL IMPRESSION(S) / ED DIAGNOSES  Final diagnoses:  Influenza     ED Discharge Orders    None       Note:  This document was prepared using Dragon voice recognition software and may include unintentional dictation errors.    Tommi Rumps, PA-C 02/02/19 1503    Nita Sickle, MD 02/03/19 [phone]

## 2019-02-10 ENCOUNTER — Emergency Department: Payer: Self-pay

## 2019-02-10 ENCOUNTER — Emergency Department
Admission: EM | Admit: 2019-02-10 | Discharge: 2019-02-10 | Disposition: A | Discharge status: Home / Self Care | Payer: Self-pay | Attending: Emergency Medicine | Admitting: Emergency Medicine

## 2019-02-10 ENCOUNTER — Other Ambulatory Visit: Payer: Self-pay

## 2019-02-10 ENCOUNTER — Encounter: Payer: Self-pay | Admitting: Emergency Medicine

## 2019-02-10 DIAGNOSIS — Y999 Unspecified external cause status: Secondary | ICD-10-CM | POA: Insufficient documentation

## 2019-02-10 DIAGNOSIS — F17210 Nicotine dependence, cigarettes, uncomplicated: Secondary | ICD-10-CM | POA: Insufficient documentation

## 2019-02-10 DIAGNOSIS — Y9389 Activity, other specified: Secondary | ICD-10-CM | POA: Insufficient documentation

## 2019-02-10 DIAGNOSIS — Y92481 Parking lot as the place of occurrence of the external cause: Secondary | ICD-10-CM | POA: Insufficient documentation

## 2019-02-10 DIAGNOSIS — S161XXA Strain of muscle, fascia and tendon at neck level, initial encounter: Secondary | ICD-10-CM | POA: Insufficient documentation

## 2019-02-10 MED ORDER — MELOXICAM 15 MG PO TABS: 15.0000 mg | ORAL_TABLET | Freq: Every day | ORAL | 0 refills | Status: DC

## 2019-02-10 MED ORDER — CYCLOBENZAPRINE HCL 10 MG PO TABS: 10.0000 mg | ORAL_TABLET | Freq: Three times a day (TID) | ORAL | 0 refills | Status: DC | PRN
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
## 2019-02-10 NOTE — Discharge Instructions (Addendum)
Follow-up with your regular doctor if not better in 3 to 5 days.  Return emergency department worsening.  Take medications as prescribed.  Apply ice to the left side of your neck.  Return as needed.
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
## 2019-02-18 ENCOUNTER — Encounter: Payer: Self-pay | Admitting: Emergency Medicine

## 2019-02-18 ENCOUNTER — Emergency Department: Payer: Self-pay

## 2019-02-18 ENCOUNTER — Other Ambulatory Visit: Payer: Self-pay

## 2019-02-18 ENCOUNTER — Emergency Department
Admission: EM | Admit: 2019-02-18 | Discharge: 2019-02-18 | Disposition: A | Discharge status: Home / Self Care | Payer: Self-pay | Attending: Emergency Medicine | Admitting: Emergency Medicine

## 2019-02-18 DIAGNOSIS — F17210 Nicotine dependence, cigarettes, uncomplicated: Secondary | ICD-10-CM | POA: Insufficient documentation

## 2019-02-18 DIAGNOSIS — M5030 Other cervical disc degeneration, unspecified cervical region: Secondary | ICD-10-CM | POA: Insufficient documentation

## 2019-02-18 DIAGNOSIS — M5412 Radiculopathy, cervical region: Secondary | ICD-10-CM | POA: Insufficient documentation

## 2019-02-18 MED ORDER — PREDNISONE 10 MG PO TABS: ORAL_TABLET | ORAL | 0 refills | Status: DC

## 2019-02-18 MED ORDER — DEXAMETHASONE SODIUM PHOSPHATE 10 MG/ML IJ SOLN
10.0000 mg | Freq: Once | INTRAMUSCULAR | Status: AC
  Administered 2019-02-18: 10 mg via INTRAMUSCULAR
  Filled 2019-02-18: qty 1

## 2019-02-18 MED ORDER — CYCLOBENZAPRINE HCL 10 MG PO TABS: 10.0000 mg | ORAL_TABLET | Freq: Three times a day (TID) | ORAL | 0 refills | Status: DC | PRN

## 2019-02-18 NOTE — ED Triage Notes (Addendum)
Pt here for pain in left shoulder and arm.  Still has pain in neck as well.  Pain improved while taking his muscle relaxers and NSAID but he ran out of his medications.  Pain in arm triggered with movement of neck.  Has a tingling sensation in arm.  Reports cannot use arm but was holding papers in left hand when arrived.  Supposed to FU at [facility] but reports he cannot pay the 75 copay so came back here.  Limited ROM of arm r/t pain in shoulder.
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
## 2019-02-18 NOTE — Discharge Instructions (Signed)
Begin taking prednisone as directed.  Continue taking Flexeril 3 times a day as needed for muscle spasms.  You will need to call and make an appointment with Dr. Adriana Simas who is the neurosurgeon on call.  Your other option is to go to [facility].

## 2019-06-08 ENCOUNTER — Emergency Department: Payer: Self-pay

## 2019-06-08 ENCOUNTER — Emergency Department
Admission: EM | Admit: 2019-06-08 | Discharge: 2019-06-09 | Disposition: A | Discharge status: Home / Self Care | Payer: Self-pay | Attending: Emergency Medicine | Admitting: Emergency Medicine

## 2019-06-08 DIAGNOSIS — W010XXA Fall on same level from slipping, tripping and stumbling without subsequent striking against object, initial encounter: Secondary | ICD-10-CM | POA: Insufficient documentation

## 2019-06-08 DIAGNOSIS — F17210 Nicotine dependence, cigarettes, uncomplicated: Secondary | ICD-10-CM | POA: Insufficient documentation

## 2019-06-08 DIAGNOSIS — Y92018 Other place in single-family (private) house as the place of occurrence of the external cause: Secondary | ICD-10-CM | POA: Insufficient documentation

## 2019-06-08 DIAGNOSIS — Y9301 Activity, walking, marching and hiking: Secondary | ICD-10-CM | POA: Insufficient documentation

## 2019-06-08 DIAGNOSIS — S161XXA Strain of muscle, fascia and tendon at neck level, initial encounter: Secondary | ICD-10-CM | POA: Insufficient documentation

## 2019-06-08 DIAGNOSIS — Y999 Unspecified external cause status: Secondary | ICD-10-CM | POA: Insufficient documentation

## 2019-06-08 MED ORDER — KETOROLAC TROMETHAMINE 60 MG/2ML IM SOLN
60.0000 mg | Freq: Once | INTRAMUSCULAR | Status: AC
  Administered 2019-06-09: 60 mg via INTRAMUSCULAR
  Filled 2019-06-08: qty 2

## 2019-06-08 MED ORDER — HYDROMORPHONE HCL 1 MG/ML IJ SOLN
1.0000 mg | Freq: Once | INTRAMUSCULAR | Status: AC
  Administered 2019-06-09: 1 mg via INTRAMUSCULAR
  Filled 2019-06-08: qty 1

## 2019-06-08 NOTE — ED Triage Notes (Signed)
Patient coming [facility] for partial fall. Patient began to fall and caught himself on counter. Patient had surgery on C4-C6 on 7/17. Patient felt popping sensation in neck when he fell. Patient c/o right neck pain radiating to shoulder, and left neck pain.
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
## 2019-06-09 MED ORDER — OXYCODONE HCL 5 MG PO TABS: 5.0000 mg | ORAL_TABLET | Freq: Four times a day (QID) | ORAL | 0 refills | Status: DC | PRN

## 2019-06-09 MED ORDER — METHOCARBAMOL 500 MG PO TABS: 500.0000 mg | ORAL_TABLET | Freq: Three times a day (TID) | ORAL | 0 refills | Status: AC | PRN

## 2019-06-09 MED ORDER — METHOCARBAMOL 500 MG PO TABS: 500.0000 mg | ORAL_TABLET | Freq: Three times a day (TID) | ORAL | 0 refills | Status: DC | PRN

## 2019-06-09 NOTE — ED Notes (Signed)
Reviewed discharge instructions, follow-up care, and prescriptions with patient. Patient verbalized understanding of all information reviewed. Patient stable, with no distress noted at this time.    

## 2019-07-22 ENCOUNTER — Other Ambulatory Visit: Payer: Self-pay

## 2019-07-22 DIAGNOSIS — Z20822 Contact with and (suspected) exposure to covid-19: Secondary | ICD-10-CM

## 2019-07-23 LAB — NOVEL CORONAVIRUS, NAA: SARS-CoV-2, NAA: NOT DETECTED

## 2019-09-10 ENCOUNTER — Emergency Department
Admission: EM | Admit: 2019-09-10 | Discharge: 2019-09-10 | Disposition: A | Discharge status: Home / Self Care | Payer: Self-pay | Attending: Emergency Medicine | Admitting: Emergency Medicine

## 2019-09-10 ENCOUNTER — Other Ambulatory Visit: Payer: Self-pay

## 2019-09-10 ENCOUNTER — Emergency Department: Payer: Self-pay

## 2019-09-10 DIAGNOSIS — F17210 Nicotine dependence, cigarettes, uncomplicated: Secondary | ICD-10-CM | POA: Insufficient documentation

## 2019-09-10 DIAGNOSIS — M542 Cervicalgia: Secondary | ICD-10-CM | POA: Insufficient documentation

## 2019-09-10 DIAGNOSIS — Z9889 Other specified postprocedural states: Secondary | ICD-10-CM | POA: Insufficient documentation

## 2019-09-10 MED ORDER — OXYCODONE-ACETAMINOPHEN 5-325 MG PO TABS
1.0000 | ORAL_TABLET | Freq: Once | ORAL | Status: AC
  Administered 2019-09-10: 1 via ORAL
  Filled 2019-09-10: qty 1

## 2019-09-10 MED ORDER — KETOROLAC TROMETHAMINE 30 MG/ML IJ SOLN
30.0000 mg | Freq: Once | INTRAMUSCULAR | Status: AC
  Administered 2019-09-10: 30 mg via INTRAMUSCULAR
  Filled 2019-09-10: qty 1
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
## 2019-11-23 ENCOUNTER — Encounter: Payer: Self-pay | Admitting: Emergency Medicine

## 2019-11-23 ENCOUNTER — Emergency Department
Admission: EM | Admit: 2019-11-23 | Discharge: 2019-11-23 | Disposition: A | Discharge status: Home / Self Care | Payer: HRSA Program | Attending: Emergency Medicine | Admitting: Emergency Medicine

## 2019-11-23 ENCOUNTER — Other Ambulatory Visit: Payer: Self-pay

## 2019-11-23 ENCOUNTER — Emergency Department: Payer: HRSA Program

## 2019-11-23 DIAGNOSIS — F17210 Nicotine dependence, cigarettes, uncomplicated: Secondary | ICD-10-CM | POA: Insufficient documentation

## 2019-11-23 DIAGNOSIS — R05 Cough: Secondary | ICD-10-CM

## 2019-11-23 DIAGNOSIS — R519 Headache, unspecified: Secondary | ICD-10-CM

## 2019-11-23 DIAGNOSIS — U071 COVID-19: Secondary | ICD-10-CM | POA: Diagnosis not present

## 2019-11-23 DIAGNOSIS — R0602 Shortness of breath: Secondary | ICD-10-CM

## 2019-11-23 DIAGNOSIS — R432 Parageusia: Secondary | ICD-10-CM

## 2019-11-23 DIAGNOSIS — R059 Cough, unspecified: Secondary | ICD-10-CM

## 2019-11-23 DIAGNOSIS — H938X3 Other specified disorders of ear, bilateral: Secondary | ICD-10-CM

## 2019-11-23 DIAGNOSIS — R430 Anosmia: Secondary | ICD-10-CM

## 2019-11-23 DIAGNOSIS — R110 Nausea: Secondary | ICD-10-CM

## 2019-11-23 DIAGNOSIS — R509 Fever, unspecified: Secondary | ICD-10-CM

## 2019-11-23 DIAGNOSIS — R42 Dizziness and giddiness: Secondary | ICD-10-CM

## 2019-11-23 DIAGNOSIS — J3489 Other specified disorders of nose and nasal sinuses: Secondary | ICD-10-CM

## 2019-11-23 DIAGNOSIS — R197 Diarrhea, unspecified: Secondary | ICD-10-CM

## 2019-11-23 LAB — SARS CORONAVIRUS 2 (TAT 6-24 HRS): SARS Coronavirus 2: POSITIVE — AB

## 2019-11-23 MED ORDER — ACETAMINOPHEN 325 MG PO TABS
ORAL_TABLET | ORAL | Status: AC
  Filled 2019-11-23: qty 2

## 2019-11-23 MED ORDER — ALBUTEROL SULFATE HFA 108 (90 BASE) MCG/ACT IN AERS: 2.0000 | INHALATION_SPRAY | Freq: Four times a day (QID) | RESPIRATORY_TRACT | 0 refills | Status: DC | PRN

## 2019-11-23 MED ORDER — ACETAMINOPHEN 325 MG PO TABS
650.0000 mg | ORAL_TABLET | Freq: Once | ORAL | Status: AC | PRN
  Administered 2019-11-23: 650 mg via ORAL

## 2019-11-23 NOTE — ED Provider Notes (Signed)
[facility] Emergency Department Provider Note ____________________________________________  Time seen: 1015  I have reviewed the triage vital signs and the nursing notes.  HISTORY  Chief Complaint  COVID sx   HPI Ruben [name] is a 49 y.o. male presents to the ER today with complaint of headache, lightheadedness, runny nose, nasal congestion, ear fullness, loss of taste/smell, cough, shortness of breath, nausea and diarrhea.  He reports the symptoms started last night but worsened this morning.  The headache is located all over his head.  He describes the pain as pressure and throbbing.  He denies visual changes.  He is blowing yellow mucus out of his nose.  He denies ear pain or decreased hearing.  He denies difficulty swallowing.  The cough is mostly nonproductive.  He is mostly short of breath with coughing and exertion but not at rest.  He has some chest tightness but denies chest pain.  He is not vomiting.  He has not noticed any blood in his stool.  He denies any rash.  He did not take any medication PTA.  He has not had any sick contacts with similar symptoms or exposure to Covid that he is aware of however he does mention that he is not required to wear a mask at his job.  Past Medical History:  Diagnosis Date  . Anxiety   . Depression   . Influenza A     Patient Active Problem List   Diagnosis Date Noted  . Influenza A 12/03/2016  . Hypotension 12/03/2016  . Tachycardia 12/03/2016  . Fever 12/03/2016    Past Surgical History:  Procedure Laterality Date  . CHOLECYSTECTOMY    . FRACTURE SURGERY    . NECK SURGERY    . TONSILLECTOMY      Prior to Admission medications   Medication Sig Start Date End Date Taking? Authorizing Provider  albuterol (VENTOLIN HFA) 108 (90 Base) MCG/ACT inhaler Inhale 2 puffs into the lungs every 6 (six) hours as needed for wheezing or shortness of breath. 11/23/19   Jearld Fenton, NP    Allergies Patient has no  known allergies.  Family History  Problem Relation Age of Onset  . Diabetes Mother   . Diabetes Father     Social History Social History   Tobacco Use  . Smoking status: Current Every Day Smoker    Packs/day: 0.75    Types: Cigarettes  . Smokeless tobacco: Never Used  Substance Use Topics  . Alcohol use: No    Comment: quit drinking and using illegal drugs in 2013  . Drug use: No    Review of Systems  Constitutional: Positive for fever.  Negative for chills or body aches. ENT: Positive for runny nose, nasal congestion, ear fullness, loss of taste/smell.  Negative for eye pain, eye redness, eye discharge or sore throat. Cardiovascular: Positive for chest tightness negative for chest pain. Respiratory: Positive for cough, shortness of breath. Gastrointestinal: Positive for nausea and diarrhea.  Negative for abdominal pain, vomiting and blood in stool. Skin: Negative for rash. Neurological: Positive for headache.  Negative for focal weakness or numbness. ____________________________________________  PHYSICAL EXAM:  VITAL SIGNS: ED Triage Vitals  Enc Vitals Group     BP 11/23/19 0950 125/74     Pulse Rate 11/23/19 0950 100     Resp 11/23/19 0950 18     Temp 11/23/19 0950 (!) 101 F (38.3 C)     Temp Source 11/23/19 0950 Oral  SpO2 11/23/19 0950 98 %     Weight 11/23/19 0951 254 lb (115.2 kg)     Height 11/23/19 0951 6\' 2"  (1.88 m)     Head Circumference --      Peak Flow --      Pain Score 11/23/19 0951 8     Pain Loc --      Pain Edu? --      Excl. in GC? --     Constitutional: Alert and oriented.  Ill-appearing but in no distress. Head: Normocephalic and atraumatic. Eyes: Conjunctivae are normal. PERRL. Normal extraocular movements Ears: Canals clear.  TMs intact bilaterally with positive serous effusion. Nose: No congestion/rhinorrhea/epistaxis. Mouth/Throat: Mucous membranes are dry, no posterior pharynx erythema or exudate  noted. Hematological/Lymphatic/Immunological: No cervical lymphadenopathy. Cardiovascular: Normal rate, regular rhythm.  Respiratory: Normal respiratory effort. No wheezes/rales/rhonchi. Gastrointestinal: Soft and nontender. No distention. Neurologic:   Normal speech and language. No gross focal neurologic deficits are appreciated. Skin:  Skin is warm, dry and intact. No rash noted.  ____________________________________________   LABS  Lab Orders     SARS CORONAVIRUS 2 (TAT 6-24 HRS) Nasopharyngeal Nasopharyngeal Swab  _____________________________________________   RADIOLOGY   Imaging Orders     DG Chest Portable 1 View  IMPRESSION:  No edema or consolidation. Cardiac silhouette normal.   ____________________________________________    INITIAL IMPRESSION / ASSESSMENT AND PLAN / ED COURSE  Acute Headache, Lightheadedness, Runny Nose, Nasal Congestion, Ear Fullness, Loss of Taste/Smell, Cough, SOB, Nausea, Diarrhea, Fever:  Likely COVID COVID swab today- we will call you with these results Chest xray Tylenol 1000 mg PO x 1 in ER Discussed symptomatic care- rest and fluids Continue Tylenol 1000 mg every 8 hours as needed for fever RX for Albuterol inhaler 1-2 puffs Q4-6H prn SOB Start Vit C and Zinc OTC- use according to bottle directions No indication for steroids, antibiotics or referral to infusion center at this time  ER return precautions discussed ____________________________________________  FINAL CLINICAL IMPRESSION(S) / ED DIAGNOSES  Final diagnoses:  Acute intractable headache, unspecified headache type  Lightheadedness  Stuffy and runny nose  Sensation of fullness in both ears  Loss of taste  Loss of smell  Cough  SOB (shortness of breath)  Nausea  Diarrhea, unspecified type  Fever, unspecified fever cause   01/21/20, NP This visit occurred during the SARS-CoV-2 public health emergency.  Safety protocols were in place, including screening  questions prior to the visit, additional usage of staff PPE, and extensive cleaning of exam room while observing appropriate contact time as indicated for disinfecting solutions.      Nicki Reaper, NP 11/23/19 1108    01/21/20, MD 11/23/19 1520

## 2019-11-23 NOTE — ED Triage Notes (Addendum)
Pt here for covid sx.  C/o cough, headache, sore throat, body aches, chills, difficulty getting deep breath, loss of taste and loss of smell.  Unsure if exposed.   Pt febrile in triage.  No pulmonary or other medical hx.  C/o mild diarrhea yesterday none today. No vomiting. Mild nausea.  Also c/o nasal congestion.  Every sx registration asked about for covid, he reported yes to.  Exaggerated breaths noted but when RN returned to give tylenol breathing normally.

## 2019-11-23 NOTE — Discharge Instructions (Signed)
You were seen today for COVID symptoms. We will call you with your test results. Your chest xray was negative for pneumonia. I have sent you a Albuterol inhaler to use for shortness of breath.  You can take Tylenol 1000 mg every 8 hours as needed for fever.  Be sure to get plenty of rest and drink plenty of fluids.  You should start vitamin C and zinc OTC according to the package directions.  Return to the ER if shortness of breath worsens, you are unable to keep liquids or solids down or extreme weakness.

## 2019-11-23 NOTE — ED Notes (Signed)
See triage note  Presents with fever,body aches and diarrhea States diarrhea started yesterday.  But fever and body aches this am

## 2019-11-24 ENCOUNTER — Telehealth: Payer: Self-pay

## 2019-11-24 NOTE — Telephone Encounter (Signed)
Notified patient of positive SARS Coronavirus 2 result. Reviewed quarantine and precautions patient verbalized understanding.

## 2020-02-03 ENCOUNTER — Encounter: Payer: Self-pay | Admitting: Emergency Medicine

## 2020-02-03 ENCOUNTER — Other Ambulatory Visit: Payer: Self-pay

## 2020-02-03 ENCOUNTER — Emergency Department
Admission: EM | Admit: 2020-02-03 | Discharge: 2020-02-03 | Disposition: A | Discharge status: Home / Self Care | Payer: Self-pay | Attending: Emergency Medicine | Admitting: Emergency Medicine

## 2020-02-03 ENCOUNTER — Emergency Department: Payer: Self-pay

## 2020-02-03 DIAGNOSIS — Y929 Unspecified place or not applicable: Secondary | ICD-10-CM | POA: Insufficient documentation

## 2020-02-03 DIAGNOSIS — Y939 Activity, unspecified: Secondary | ICD-10-CM | POA: Insufficient documentation

## 2020-02-03 DIAGNOSIS — F17210 Nicotine dependence, cigarettes, uncomplicated: Secondary | ICD-10-CM | POA: Insufficient documentation

## 2020-02-03 DIAGNOSIS — Y999 Unspecified external cause status: Secondary | ICD-10-CM | POA: Insufficient documentation

## 2020-02-03 DIAGNOSIS — S81852A Open bite, left lower leg, initial encounter: Secondary | ICD-10-CM | POA: Insufficient documentation

## 2020-02-03 DIAGNOSIS — W540XXA Bitten by dog, initial encounter: Secondary | ICD-10-CM | POA: Insufficient documentation

## 2020-02-03 MED ORDER — TRAMADOL HCL 50 MG PO TABS: 50.0000 mg | ORAL_TABLET | Freq: Four times a day (QID) | ORAL | 0 refills | Status: DC | PRN

## 2020-02-03 MED ORDER — AMOXICILLIN-POT CLAVULANATE 875-125 MG PO TABS: 1.0000 | ORAL_TABLET | Freq: Two times a day (BID) | ORAL | 0 refills | Status: AC

## 2020-02-03 MED ORDER — TRAMADOL HCL 50 MG PO TABS
50.0000 mg | ORAL_TABLET | Freq: Once | ORAL | Status: AC
  Administered 2020-02-03: 50 mg via ORAL
  Filled 2020-02-03: qty 1

## 2020-02-03 NOTE — Discharge Instructions (Addendum)
Wash the area with soap and water daily. Take the antibiotic as prescribed, pain medication as needed, also try Tylenol and ibuprofen Return emergency department worsening

## 2020-02-03 NOTE — ED Triage Notes (Signed)
Pt reports was bit by a dog yesterday in his left leg. Pt reports animal control was called to the scene and got the dog. Pt reports per animal control the shots are up to date. Pt reports here today because his leg is painful and there si a still a cut there.

## 2020-02-03 NOTE — ED Notes (Signed)
See triage note   Presents s/p animal bite to left lower leg  States this happened yesterday

## 2020-02-03 NOTE — ED Provider Notes (Signed)
[facility] Emergency Department Provider Note  ____________________________________________   First MD Initiated Contact with Patient 02/03/20 1525     (approximate)  I have reviewed the triage vital signs and the nursing notes.   HISTORY  Chief Complaint Animal Bite    HPI Ruben [name] is a 49 y.o. male presents emergency department complaint of a dog bite yesterday.  States he had been chased by this dog before.  Animal control was pick this dog up several times for running loose.  States it is a pit bull it nipped at his leg and tore his pants and then it bit the lower leg and hung on.  Tdap is up-to-date.  Dog's immunizations are up-to-date.  He is complaining of left lower leg pain.    Past Medical History:  Diagnosis Date  . Anxiety   . Depression   . Influenza A     Patient Active Problem List   Diagnosis Date Noted  . Influenza A 12/03/2016  . Hypotension 12/03/2016  . Tachycardia 12/03/2016  . Fever 12/03/2016    Past Surgical History:  Procedure Laterality Date  . CHOLECYSTECTOMY    . FRACTURE SURGERY    . NECK SURGERY    . TONSILLECTOMY      Prior to Admission medications   Medication Sig Start Date End Date Taking? Authorizing Provider  albuterol (VENTOLIN HFA) 108 (90 Base) MCG/ACT inhaler Inhale 2 puffs into the lungs every 6 (six) hours as needed for wheezing or shortness of breath. 11/23/19   Lorre Munroe, NP  amoxicillin-clavulanate (AUGMENTIN) 875-125 MG tablet Take 1 tablet by mouth 2 (two) times daily for 7 days. 02/03/20 02/10/20  Jamale Spangler, Roselyn Bering, PA-C  traMADol (ULTRAM) 50 MG tablet Take 1 tablet (50 mg total) by mouth every 6 (six) hours as needed. 02/03/20   Faythe Ghee, PA-C    Allergies Patient has no known allergies.  Family History  Problem Relation Age of Onset  . Diabetes Mother   . Diabetes Father     Social History Social History   Tobacco Use  . Smoking status: Current Every Day Smoker      Packs/day: 0.75    Types: Cigarettes  . Smokeless tobacco: Never Used  Substance Use Topics  . Alcohol use: No    Comment: quit drinking and using illegal drugs in 2013  . Drug use: No    Review of Systems  Constitutional: No fever/chills Eyes: No visual changes. ENT: No sore throat. Respiratory: Denies cough Cardiovascular: Denies chest pain Gastrointestinal: Denies abdominal pain Genitourinary: Negative for dysuria. Musculoskeletal: Negative for back pain.  Positive left leg pain Skin: Negative for rash.  Positive for dog psychiatric: no mood changes,     ____________________________________________   PHYSICAL EXAM:  VITAL SIGNS: ED Triage Vitals  Enc Vitals Group     BP 02/03/20 1440 (!) 125/96     Pulse Rate 02/03/20 1440 86     Resp 02/03/20 1440 20     Temp 02/03/20 1440 98.4 F (36.9 C)     Temp Source 02/03/20 1440 Oral     SpO2 02/03/20 1440 99 %     Weight 02/03/20 1417 254 lb (115.2 kg)     Height 02/03/20 1417 6\' 2"  (1.88 m)     Head Circumference --      Peak Flow --      Pain Score 02/03/20 1417 10     Pain Loc --  Pain Edu? --      Excl. in [place]? --     Constitutional: Alert and oriented. Well appearing and in no acute distress. Eyes: Conjunctivae are normal.  Head: Atraumatic. Nose: No congestion/rhinnorhea. Mouth/Throat: Mucous membranes are moist.   Neck:  supple no lymphadenopathy noted Cardiovascular: Normal rate, regular rhythm. Respiratory: Normal respiratory effort.  GU: deferred Musculoskeletal: Positive for dog bite to the left lower leg along the posterior on the medial and lateral aspects, area is not weeping, bleeding is controlled, no redness or pus noted, no crepitus noted  neurologic:  Normal speech and language.  Skin:  Skin is warm, dry. . No rash noted. Psychiatric: Mood and affect are normal. Speech and behavior are normal.  ____________________________________________   LABS (all labs ordered are listed, but  only abnormal results are displayed)  Labs Reviewed - No data to display ____________________________________________   ____________________________________________  RADIOLOGY  X-ray left tib-fib is negative  ____________________________________________   PROCEDURES  Procedure(s) performed: crutches  Procedures    ____________________________________________   INITIAL IMPRESSION / ASSESSMENT AND PLAN / ED COURSE  Pertinent labs & imaging results that were available during my care of the patient were reviewed by me and considered in my medical decision making (see chart for details).   Patient's 49 year old male presents emergency department with a dog bite from yesterday.  Tdap is up-to-date.  Dog's immunizations are up-to-date.  Animal control has already been notified.  Physical exam shows doorway to left lower leg with tenderness but no redness pus or swelling  X-ray of the left tib-fib is negative  Explained the findings to the patient.  He was given a tramadol here for pain.  Crutches.  He is to take the Augmentin as prescribed.  Follow-up with his regular doctor if not improving.  Dr. Truitt Leep if continued lower leg pain.  States he understands will comply.  Is discharged stable condition.    Ruben [name] was evaluated in Emergency Department on 02/03/2020 for the symptoms described in the history of present illness. He was evaluated in the context of the global COVID-19 pandemic, which necessitated consideration that the patient might be at risk for infection with the SARS-CoV-2 virus that causes COVID-19. Institutional protocols and algorithms that pertain to the evaluation of patients at risk for COVID-19 are in a state of rapid change based on information released by regulatory bodies including the CDC and federal and state organizations. These policies and algorithms were followed during the patient's care in the ED.   As part of my medical decision making, I  reviewed the following data within the [place] notes reviewed and incorporated, Old chart reviewed, Radiograph reviewed , Notes from prior ED visits and [place] Controlled Substance Database  ____________________________________________   FINAL CLINICAL IMPRESSION(S) / ED DIAGNOSES  Final diagnoses:  Dog bite, initial encounter      NEW MEDICATIONS STARTED DURING THIS VISIT:  New Prescriptions   AMOXICILLIN-CLAVULANATE (AUGMENTIN) 875-125 MG TABLET    Take 1 tablet by mouth 2 (two) times daily for 7 days.   TRAMADOL (ULTRAM) 50 MG TABLET    Take 1 tablet (50 mg total) by mouth every 6 (six) hours as needed.     Note:  This document was prepared using Dragon voice recognition software and may include unintentional dictation errors.    Versie Starks, PA-C 02/03/20 1627    Vanessa [place], MD 02/03/20 [phone]

## 2020-06-04 ENCOUNTER — Other Ambulatory Visit: Payer: Self-pay

## 2020-06-04 DIAGNOSIS — R112 Nausea with vomiting, unspecified: Secondary | ICD-10-CM | POA: Insufficient documentation

## 2020-06-04 DIAGNOSIS — N200 Calculus of kidney: Secondary | ICD-10-CM | POA: Insufficient documentation

## 2020-06-04 DIAGNOSIS — F17210 Nicotine dependence, cigarettes, uncomplicated: Secondary | ICD-10-CM | POA: Insufficient documentation

## 2020-06-04 MED ORDER — SODIUM CHLORIDE 0.9% FLUSH
3.0000 mL | Freq: Once | INTRAVENOUS | Status: AC
  Administered 2020-06-05: 3 mL via INTRAVENOUS

## 2020-06-04 MED ORDER — ONDANSETRON 4 MG PO TBDP
4.0000 mg | ORAL_TABLET | Freq: Once | ORAL | Status: DC | PRN
  Filled 2020-06-04: qty 1

## 2020-06-04 NOTE — ED Triage Notes (Signed)
Pt to the er for right side back pain and nausea. PT loc seems decreased and pt is falling asleep in the wheelchair. Pt is arousable.

## 2020-06-04 NOTE — ED Triage Notes (Signed)
Pt reports more flank pain and is actively vomiting in triage.

## 2020-06-05 ENCOUNTER — Emergency Department
Admission: EM | Admit: 2020-06-05 | Discharge: 2020-06-05 | Disposition: A | Discharge status: Home / Self Care | Payer: Self-pay | Attending: Emergency Medicine | Admitting: Emergency Medicine

## 2020-06-05 ENCOUNTER — Emergency Department: Payer: Self-pay

## 2020-06-05 DIAGNOSIS — N200 Calculus of kidney: Secondary | ICD-10-CM

## 2020-06-05 DIAGNOSIS — N23 Unspecified renal colic: Secondary | ICD-10-CM

## 2020-06-05 LAB — URINALYSIS, COMPLETE (UACMP) WITH MICROSCOPIC
Bacteria, UA: NONE SEEN
Bilirubin Urine: NEGATIVE
Glucose, UA: NEGATIVE mg/dL
Ketones, ur: 5 mg/dL — AB
Leukocytes,Ua: NEGATIVE
Nitrite: NEGATIVE
Protein, ur: NEGATIVE mg/dL
Specific Gravity, Urine: 1.019 (ref 1.005–1.030)
pH: 6 (ref 5.0–8.0)

## 2020-06-05 LAB — COMPREHENSIVE METABOLIC PANEL
ALT: 14 U/L (ref 0–44)
AST: 15 U/L (ref 15–41)
Albumin: 4.2 g/dL (ref 3.5–5.0)
Alkaline Phosphatase: 52 U/L (ref 38–126)
Anion gap: 11 (ref 5–15)
BUN: 11 mg/dL (ref 6–20)
CO2: 22 mmol/L (ref 22–32)
Calcium: 8.9 mg/dL (ref 8.9–10.3)
Chloride: 108 mmol/L (ref 98–111)
Creatinine, Ser: 1.22 mg/dL (ref 0.61–1.24)
GFR calc Af Amer: >60 mL/min (ref >60)
GFR calc non Af Amer: >60 mL/min (ref >60)
Glucose, Bld: 134 mg/dL — ABNORMAL HIGH (ref 70–99)
Potassium: 4.1 mmol/L (ref 3.5–5.1)
Sodium: 141 mmol/L (ref 135–145)
Total Bilirubin: 0.9 mg/dL (ref 0.3–1.2)
Total Protein: 7 g/dL (ref 6.5–8.1)

## 2020-06-05 LAB — CBC
HCT: 42.1 % (ref 39.0–52.0)
Hemoglobin: 14.6 g/dL (ref 13.0–17.0)
MCH: 32.1 pg (ref 26.0–34.0)
MCHC: 34.7 g/dL (ref 30.0–36.0)
MCV: 92.5 fL (ref 80.0–100.0)
Platelets: 237 10*3/uL (ref 150–400)
RBC: 4.55 MIL/uL (ref 4.22–5.81)
RDW: 12.1 % (ref 11.5–15.5)
WBC: 13 10*3/uL — ABNORMAL HIGH (ref 4.0–10.5)
nRBC: 0 % (ref 0.0–0.2)

## 2020-06-05 LAB — LIPASE, BLOOD: Lipase: 24 U/L (ref 11–51)

## 2020-06-05 MED ORDER — TAMSULOSIN HCL 0.4 MG PO CAPS
0.4000 mg | ORAL_CAPSULE | Freq: Once | ORAL | Status: AC
  Administered 2020-06-05: 0.4 mg via ORAL
  Filled 2020-06-05: qty 1

## 2020-06-05 MED ORDER — KETOROLAC TROMETHAMINE 30 MG/ML IJ SOLN
15.0000 mg | Freq: Once | INTRAMUSCULAR | Status: AC
  Administered 2020-06-05: 15 mg via INTRAVENOUS
  Filled 2020-06-05: qty 1

## 2020-06-05 MED ORDER — IBUPROFEN 600 MG PO TABS: 600.0000 mg | ORAL_TABLET | Freq: Four times a day (QID) | ORAL | 0 refills | Status: DC | PRN

## 2020-06-05 MED ORDER — FENTANYL CITRATE (PF) 100 MCG/2ML IJ SOLN
50.0000 ug | Freq: Once | INTRAMUSCULAR | Status: AC
  Administered 2020-06-05: 50 ug via INTRAVENOUS
  Filled 2020-06-05: qty 2

## 2020-06-05 MED ORDER — TAMSULOSIN HCL 0.4 MG PO CAPS: 0.4000 mg | ORAL_CAPSULE | Freq: Every day | ORAL | 0 refills | Status: AC

## 2020-06-05 MED ORDER — ONDANSETRON HCL 4 MG/2ML IJ SOLN
4.0000 mg | Freq: Once | INTRAMUSCULAR | Status: AC
  Administered 2020-06-05: 4 mg via INTRAVENOUS
  Filled 2020-06-05: qty 2

## 2020-06-05 MED ORDER — ONDANSETRON 4 MG PO TBDP: 4.0000 mg | ORAL_TABLET | Freq: Three times a day (TID) | ORAL | 0 refills | Status: DC | PRN

## 2020-06-05 NOTE — ED Provider Notes (Signed)
[facility] Emergency Department Provider Note  ____________________________________________  Time seen: Approximately 5:12 AM  I have reviewed the triage vital signs and the nursing notes.   HISTORY  Chief Complaint Back Pain   HPI Ruben [name] is a 49 y.o. male with a history of depression anxiety who presents for evaluation of right flank pain.  Patient reports the pain started about an hour prior to arrival.  The pain is severe, sharp, located in the right flank and radiating across his lower abdomen.  Associated with nausea and vomiting.   No chest pain or shortness of breath, no dysuria or hematuria, no diarrhea or constipation.  Patient denies prior history of kidney stones.  Past Medical History:  Diagnosis Date  . Anxiety   . Depression   . Influenza A     Patient Active Problem List   Diagnosis Date Noted  . Influenza A 12/03/2016  . Hypotension 12/03/2016  . Tachycardia 12/03/2016  . Fever 12/03/2016    Past Surgical History:  Procedure Laterality Date  . CHOLECYSTECTOMY    . FRACTURE SURGERY    . NECK SURGERY    . TONSILLECTOMY      Prior to Admission medications   Medication Sig Start Date End Date Taking? Authorizing Provider  albuterol (VENTOLIN HFA) 108 (90 Base) MCG/ACT inhaler Inhale 2 puffs into the lungs every 6 (six) hours as needed for wheezing or shortness of breath. 11/23/19   Lorre Munroe, NP  ibuprofen (ADVIL) 600 MG tablet Take 1 tablet (600 mg total) by mouth every 6 (six) hours as needed. 06/05/20   Nita Sickle, MD  ondansetron (ZOFRAN ODT) 4 MG disintegrating tablet Take 1 tablet (4 mg total) by mouth every 8 (eight) hours as needed. 06/05/20   Nita Sickle, MD  tamsulosin (FLOMAX) 0.4 MG CAPS capsule Take 1 capsule (0.4 mg total) by mouth daily for 7 days. 06/05/20 06/12/20  Nita Sickle, MD  traMADol (ULTRAM) 50 MG tablet Take 1 tablet (50 mg total) by mouth every 6 (six) hours as needed.  02/03/20   Faythe Ghee, PA-C    Allergies Patient has no known allergies.  Family History  Problem Relation Age of Onset  . Diabetes Mother   . Diabetes Father     Social History Social History   Tobacco Use  . Smoking status: Current Every Day Smoker    Packs/day: 0.75    Types: Cigarettes  . Smokeless tobacco: Never Used  Substance Use Topics  . Alcohol use: No    Comment: quit drinking and using illegal drugs in 2013  . Drug use: No    Review of Systems  Constitutional: Negative for fever. Eyes: Negative for visual changes. ENT: Negative for sore throat. Neck: No neck pain  Cardiovascular: Negative for chest pain. Respiratory: Negative for shortness of breath. Gastrointestinal: Negative for abdominal pain or diarrhea. + N/V Genitourinary: Negative for dysuria. + R flank pain Musculoskeletal: Negative for back pain. Skin: Negative for rash. Neurological: Negative for headaches, weakness or numbness. Psych: No SI or HI  ____________________________________________   PHYSICAL EXAM:  VITAL SIGNS: ED Triage Vitals  Enc Vitals Group     BP 06/04/20 2344 (!) 140/97     Pulse Rate 06/04/20 2344 62     Resp 06/04/20 2344 14     Temp 06/04/20 2344 97.8 F (36.6 C)     Temp Source 06/04/20 2344 Oral     SpO2 06/04/20 2344 98 %  Weight 06/04/20 2345 (!) 238 lb (108 kg)     Height 06/04/20 2345 6\' 2"  (1.88 m)     Head Circumference --      Peak Flow --      Pain Score 06/04/20 2344 10     Pain Loc --      Pain Edu? --      Excl. in GC? --     Constitutional: Alert and oriented. Well appearing and in no apparent distress. HEENT:      Head: Normocephalic and atraumatic.         Eyes: Conjunctivae are normal. Sclera is non-icteric.       Mouth/Throat: Mucous membranes are moist.       Neck: Supple with no signs of meningismus. Cardiovascular: Regular rate and rhythm. No murmurs, gallops, or rubs.  Respiratory: Normal respiratory effort. Lungs are  clear to auscultation bilaterally.  Gastrointestinal: Soft, non tender, and non distended with positive bowel sounds. No rebound or guarding. Genitourinary: No CVA tenderness. Musculoskeletal: No edema, cyanosis, or erythema of extremities. Neurologic: Normal speech and language. Face is symmetric. Moving all extremities. No gross focal neurologic deficits are appreciated. Skin: Skin is warm, dry and intact. No rash noted. Psychiatric: Mood and affect are normal. Speech and behavior are normal.  ____________________________________________   LABS (all labs ordered are listed, but only abnormal results are displayed)  Labs Reviewed  COMPREHENSIVE METABOLIC PANEL - Abnormal; Notable for the following components:      Result Value   Glucose, Bld 134 (*)    All other components within normal limits  CBC - Abnormal; Notable for the following components:   WBC 13.0 (*)    All other components within normal limits  URINALYSIS, COMPLETE (UACMP) WITH MICROSCOPIC - Abnormal; Notable for the following components:   Color, Urine YELLOW (*)    APPearance HAZY (*)    Hgb urine dipstick SMALL (*)    Ketones, ur 5 (*)    All other components within normal limits  LIPASE, BLOOD   ____________________________________________  EKG  none ____________________________________________  RADIOLOGY  I have personally reviewed the images performed during this visit and I agree with the Radiologist's read.   Interpretation by Radiologist:  CT Renal Stone Study  Result Date: 06/05/2020 CLINICAL DATA:  Right-sided flank pain. EXAM: CT ABDOMEN AND PELVIS WITHOUT CONTRAST TECHNIQUE: Multidetector CT imaging of the abdomen and pelvis was performed following the standard protocol without IV contrast. COMPARISON:  June 04, 2018 FINDINGS: Lower chest: The lung bases are clear. The heart size is normal. Hepatobiliary: The liver is normal. Status post cholecystectomy.There is no biliary ductal dilation.  Pancreas: Normal contours without ductal dilatation. No peripancreatic fluid collection. Spleen: Unremarkable. Adrenals/Urinary Tract: --Adrenal glands: Unremarkable. --Right kidney/ureter: There is mild right-sided hydronephrosis secondary to an obstructing 4 mm stone in the proximal right ureter (axial series 2, image 42). There are additional punctate nonobstructing stones on the right. --Left kidney/ureter: No hydronephrosis or radiopaque kidney stones. --Urinary bladder: Unremarkable. Stomach/Bowel: --Stomach/Duodenum: No hiatal hernia or other gastric abnormality. Normal duodenal course and caliber. --Small bowel: Unremarkable. --Colon: There are few scattered colonic diverticula without CT evidence for diverticulitis. --Appendix: Normal. Vascular/Lymphatic: Atherosclerotic calcification is present within the non-aneurysmal abdominal aorta, without hemodynamically significant stenosis. --No retroperitoneal lymphadenopathy. --No mesenteric lymphadenopathy. --No pelvic or inguinal lymphadenopathy. Reproductive: Unremarkable Other: No ascites or free air. The abdominal wall is normal. Musculoskeletal. No acute displaced fractures. IMPRESSION: 1. Mild right-sided hydronephrosis secondary to an obstructing 4 mm stone  in the proximal right ureter. 2. Additional punctate nonobstructing stones on the right. Aortic Atherosclerosis (ICD10-I70.0). Electronically Signed   By: Katherine Mantle M.D.   On: 06/05/2020 00:36     ____________________________________________   PROCEDURES  Procedure(s) performed: None Procedures Critical Care performed:  None ____________________________________________   INITIAL IMPRESSION / ASSESSMENT AND PLAN / ED COURSE   49 y.o. male with a history of depression anxiety who presents for evaluation of sudden onset severe right flank pain associated with nausea and vomiting.  Patient continues to complain of 10 out of 10 pain.  Abdomen is soft with no tenderness, no CVA  tenderness.  Patient is otherwise neurologically intact.  CT showing a 4 mm obstructing right proximal ureteral stone.  UA with no overlying UTI.  No AKI.  Patient has elevated white count most likely stress-induced due to pain.  Will give Flomax, IV Toradol, IV fentanyl, IV Zofran and reassess.  Old medical records reviewed.    _________________________ 7:14 AM on 06/05/2020 ----------------------------------------- Pain is well controlled.  Patient will will be discharged now.  Discussed pain control and my standard return precautions.   _____________________________________________ Please note:  Patient was evaluated in Emergency Department today for the symptoms described in the history of present illness. Patient was evaluated in the context of the global COVID-19 pandemic, which necessitated consideration that the patient might be at risk for infection with the SARS-CoV-2 virus that causes COVID-19. Institutional protocols and algorithms that pertain to the evaluation of patients at risk for COVID-19 are in a state of rapid change based on information released by regulatory bodies including the CDC and federal and state organizations. These policies and algorithms were followed during the patient's care in the ED.  Some ED evaluations and interventions may be delayed as a result of limited staffing during the pandemic.   [place] Controlled Substance Database was reviewed by me. ____________________________________________   FINAL CLINICAL IMPRESSION(S) / ED DIAGNOSES   Final diagnoses:  Kidney stone  Renal colic on right side      NEW MEDICATIONS STARTED DURING THIS VISIT:  ED Discharge Orders         Ordered    tamsulosin (FLOMAX) 0.4 MG CAPS capsule  Daily     Discontinue  Reprint     06/05/20 0700    ondansetron (ZOFRAN ODT) 4 MG disintegrating tablet  Every 8 hours PRN     Discontinue  Reprint     06/05/20 0700    ibuprofen (ADVIL) 600 MG tablet  Every 6 hours PRN,   Status:   Discontinued     Reprint     06/05/20 0700    ibuprofen (ADVIL) 600 MG tablet  Every 6 hours PRN     Discontinue  Reprint     06/05/20 0713           Note:  This document was prepared using Dragon voice recognition software and may include unintentional dictation errors.    Don Perking, [place], MD 06/05/20 [phone]

## 2020-06-05 NOTE — Discharge Instructions (Signed)

## 2021-01-02 ENCOUNTER — Other Ambulatory Visit: Payer: Self-pay

## 2021-01-02 ENCOUNTER — Emergency Department
Admission: EM | Admit: 2021-01-02 | Discharge: 2021-01-02 | Disposition: A | Discharge status: Home / Self Care | Payer: Self-pay | Attending: Student in an Organized Health Care Education/Training Program | Admitting: Student in an Organized Health Care Education/Training Program

## 2021-01-02 ENCOUNTER — Emergency Department: Payer: Self-pay

## 2021-01-02 DIAGNOSIS — M25512 Pain in left shoulder: Secondary | ICD-10-CM | POA: Insufficient documentation

## 2021-01-02 DIAGNOSIS — F17210 Nicotine dependence, cigarettes, uncomplicated: Secondary | ICD-10-CM | POA: Insufficient documentation

## 2021-01-02 MED ORDER — HYDROCODONE-ACETAMINOPHEN 5-325 MG PO TABS
1.0000 | ORAL_TABLET | Freq: Once | ORAL | Status: AC
  Administered 2021-01-02: 1 via ORAL
  Filled 2021-01-02: qty 1

## 2021-01-02 MED ORDER — MELOXICAM 15 MG PO TABS: 15.0000 mg | ORAL_TABLET | Freq: Every day | ORAL | 0 refills | Status: AC

## 2021-01-02 MED ORDER — METHOCARBAMOL 750 MG PO TABS: 750.0000 mg | ORAL_TABLET | Freq: Four times a day (QID) | ORAL | 0 refills | Status: AC | PRN

## 2021-01-02 NOTE — ED Notes (Signed)
Patient transported to X-ray 

## 2021-01-02 NOTE — ED Notes (Signed)
Patient appears to be sleeping.

## 2021-01-02 NOTE — Discharge Instructions (Signed)
Take anti-inflammatory and muscle relaxant as prescribed.  Follow-up with orthopedics.  Return to ER for any worsening.

## 2021-01-02 NOTE — ED Triage Notes (Signed)
Pt presents via POV c/o left shoulder pain since yesterday. Denied injury. Reports difficulty moving left arm.

## 2021-01-02 NOTE — ED Notes (Signed)
Upon waking patient, patient reports pain to the L shoulder. Patient reports inability to lift right shoulder since last night. Patient denies injury.

## 2021-01-04 NOTE — ED Provider Notes (Signed)
[facility] Emergency Department Provider Note  ____________________________________________   Event Date/Time   First MD Initiated Contact with Patient 01/02/21 1339     (approximate)  I have reviewed the triage vital signs and the nursing notes.   HISTORY  Chief Complaint Shoulder Pain  HPI Ruben [name] is a 50 y.o. male who presents to the emergency department for evaluation of left shoulder pain that began yesterday.  This was not associated with the injury.  Patient denies history of injury to the left shoulder.  He reports that he woke up with pain in the shoulder.  Reports pain is more significant with movement of the shoulder, particularly in abduction and external rotation.  He reports associated weakness that is isolated to the shoulder.  He denies any issues with range of motion or weakness of the left elbow wrist or hand.  He denies paresthesias.  He does have a history of surgery to the neck, but denies any current neck pain.  He is not tried any significant alleviating measures at home.  Pain is reported 10/10.         Past Medical History:  Diagnosis Date  . Anxiety   . Depression   . Influenza A     Patient Active Problem List   Diagnosis Date Noted  . Influenza A 12/03/2016  . Hypotension 12/03/2016  . Tachycardia 12/03/2016  . Fever 12/03/2016    Past Surgical History:  Procedure Laterality Date  . CHOLECYSTECTOMY    . FRACTURE SURGERY    . NECK SURGERY    . TONSILLECTOMY      Prior to Admission medications   Medication Sig Start Date End Date Taking? Authorizing Provider  albuterol (VENTOLIN HFA) 108 (90 Base) MCG/ACT inhaler Inhale 2 puffs into the lungs every 6 (six) hours as needed for wheezing or shortness of breath. 11/23/19  Yes Lorre Munroe, NP  ibuprofen (ADVIL) 600 MG tablet Take 1 tablet (600 mg total) by mouth every 6 (six) hours as needed. 06/05/20  Yes Don Perking, [place], MD  meloxicam (MOBIC) 15 MG  tablet Take 1 tablet (15 mg total) by mouth daily for 15 days. 01/02/21 01/17/21 Yes Graylen Noboa, Ruben Gottron, PA  methocarbamol (ROBAXIN-750) 750 MG tablet Take 1 tablet (750 mg total) by mouth 4 (four) times daily as needed for up to 10 days for muscle spasms. 01/02/21 01/12/21 Yes Jacky Hartung, Ruben Gottron, PA  ondansetron (ZOFRAN ODT) 4 MG disintegrating tablet Take 1 tablet (4 mg total) by mouth every 8 (eight) hours as needed. 06/05/20   Nita Sickle, MD  traMADol (ULTRAM) 50 MG tablet Take 1 tablet (50 mg total) by mouth every 6 (six) hours as needed. 02/03/20   Faythe Ghee, PA-C    Allergies Patient has no known allergies.  Family History  Problem Relation Age of Onset  . Diabetes Mother   . Diabetes Father     Social History Social History   Tobacco Use  . Smoking status: Current Every Day Smoker    Packs/day: 0.75    Types: Cigarettes  . Smokeless tobacco: Never Used  Substance Use Topics  . Alcohol use: No    Comment: quit drinking and using illegal drugs in 2013  . Drug use: No    Review of Systems  Constitutional: No fever/chills Eyes: No visual changes. ENT: No sore throat. Cardiovascular: Denies chest pain. Respiratory: Denies shortness of breath. Gastrointestinal: No abdominal pain.  No nausea, no vomiting.  No diarrhea.  No constipation. Genitourinary: Negative for dysuria. Musculoskeletal: + Left shoulder pain Skin: Negative for rash. Neurological: Negative for headaches, focal weakness or numbness.  ____________________________________________   PHYSICAL EXAM:  VITAL SIGNS: ED Triage Vitals  Enc Vitals Group     BP 01/02/21 1252 120/80     Pulse Rate 01/02/21 1252 88     Resp 01/02/21 1252 16     Temp 01/02/21 1252 98.3 F (36.8 C)     Temp Source 01/02/21 1252 Oral     SpO2 01/02/21 1252 94 %     Weight 01/02/21 1446 242 lb 8.1 oz (110 kg)     Height 01/02/21 1446 6\' 2"  (1.88 m)     Head Circumference --      Peak Flow --      Pain Score 01/02/21  1651 5     Pain Loc --      Pain Edu? --      Excl. in GC? --    Constitutional: Alert and oriented. Well appearing and in no acute distress. Eyes: Conjunctivae are normal. PERRL. EOMI. Head: Atraumatic. Nose: No congestion/rhinnorhea. Mouth/Throat: Mucous membranes are moist.  Neck: No stridor.  There is no tenderness to palpation of the midline or paraspinals of the cervical spine. Cardiovascular: Normal rate, regular rhythm. Grossly normal heart sounds.  Good peripheral circulation. Respiratory: Normal respiratory effort.  No retractions. Lungs CTAB. Gastrointestinal: Soft and nontender. No distention. No abdominal bruits. No CVA tenderness. Musculoskeletal: There is tenderness to palpation of the superior trapezius as well as periscapular region of the left shoulder.  There is also mild tenderness to palpation of the anterior aspect the glenohumeral joint.  Patient has limited range of motion of the left shoulder secondary to pain.  Patient's flexion is limited to approximately 30 degrees, abduction to approximately 15 degrees.  Patient is able to internally rotate without difficulty but has pain with external rotation.  Grip strength is equal bilaterally.  Radial pulse 2+ bilaterally.  Elbow wrist and hand range of motion full without difficulty. Neurologic:  Normal speech and language. No gross focal neurologic deficits are appreciated. Skin:  Skin is warm, dry and intact. No rash noted. Psychiatric: Mood and affect are normal. Speech and behavior are normal.   ____________________________________________  RADIOLOGY I, 01/04/21, personally viewed and evaluated these images (plain radiographs) as part of my medical decision making, as well as reviewing the written report by the radiologist.  ED provider interpretation: No acute fracture noted of the left shoulder; previous cervical fusion present without evidence of hardware  failure  ____________________________________________   INITIAL IMPRESSION / ASSESSMENT AND PLAN / ED COURSE  As part of my medical decision making, I reviewed the following data within the electronic MEDICAL RECORD NUMBER Nursing notes reviewed and incorporated, Radiograph reviewed, Notes from prior ED visits and  Controlled Substance Database        Patient is a 50 year old male who presents to the emergency department for evaluation of left shoulder pain has been present since yesterday without trauma.  In triage, the patient has normal vital signs.  On physical exam, patient has tenderness that is mostly isolated to the superior trapezius and periscapular region.  He has limited range of motion of the left shoulder secondary to pain.  Patient is distally neurovascularly intact without difficulty of movement of the elbow wrist or hand.  X-rays are negative for any acute fracture and there is no evidence of hardware lucency of the cervical spine prior fusion.  Will initiate treatment with anti-inflammatories and muscle relaxants.  Patient advised to return here for any acute worsening, or follow-up with Ortho on outpatient basis.  Patient is amenable with this plan he stable time for outpatient therapy.      ____________________________________________   FINAL CLINICAL IMPRESSION(S) / ED DIAGNOSES  Final diagnoses:  Acute pain of left shoulder     ED Discharge Orders         Ordered    meloxicam (MOBIC) 15 MG tablet  Daily        01/02/21 1614    methocarbamol (ROBAXIN-750) 750 MG tablet  4 times daily PRN        01/02/21 1614          *Please note:  Romelle Starcher [name] was evaluated in Emergency Department on 01/04/2021 for the symptoms described in the history of present illness. He was evaluated in the context of the global COVID-19 pandemic, which necessitated consideration that the patient might be at risk for infection with the SARS-CoV-2 virus that causes COVID-19.  Institutional protocols and algorithms that pertain to the evaluation of patients at risk for COVID-19 are in a state of rapid change based on information released by regulatory bodies including the CDC and federal and state organizations. These policies and algorithms were followed during the patient's care in the ED.  Some ED evaluations and interventions may be delayed as a result of limited staffing during and the pandemic.*   Note:  This document was prepared using Dragon voice recognition software and may include unintentional dictation errors.   Lucy Chris, PA 01/04/21 1513    Willy Eddy, MD 01/04/21 [phone]

## 2021-03-14 ENCOUNTER — Other Ambulatory Visit: Payer: Self-pay

## 2021-03-14 ENCOUNTER — Inpatient Hospital Stay
Admission: EM | Admit: 2021-03-14 | Discharge: 2021-03-17 | DRG: 566 | Disposition: A | Discharge status: Home / Self Care | Payer: Self-pay | Attending: Internal Medicine | Admitting: Internal Medicine

## 2021-03-14 ENCOUNTER — Encounter: Payer: Self-pay | Admitting: *Deleted

## 2021-03-14 DIAGNOSIS — M25451 Effusion, right hip: Principal | ICD-10-CM | POA: Diagnosis present

## 2021-03-14 DIAGNOSIS — R1031 Right lower quadrant pain: Secondary | ICD-10-CM | POA: Diagnosis present

## 2021-03-14 DIAGNOSIS — Z20822 Contact with and (suspected) exposure to covid-19: Secondary | ICD-10-CM | POA: Diagnosis present

## 2021-03-14 DIAGNOSIS — Z833 Family history of diabetes mellitus: Secondary | ICD-10-CM

## 2021-03-14 DIAGNOSIS — F17210 Nicotine dependence, cigarettes, uncomplicated: Secondary | ICD-10-CM | POA: Diagnosis present

## 2021-03-14 DIAGNOSIS — D649 Anemia, unspecified: Secondary | ICD-10-CM | POA: Diagnosis present

## 2021-03-14 DIAGNOSIS — Z87442 Personal history of urinary calculi: Secondary | ICD-10-CM

## 2021-03-14 DIAGNOSIS — M5412 Radiculopathy, cervical region: Secondary | ICD-10-CM | POA: Diagnosis present

## 2021-03-14 DIAGNOSIS — M879 Osteonecrosis, unspecified: Secondary | ICD-10-CM

## 2021-03-14 DIAGNOSIS — Z79899 Other long term (current) drug therapy: Secondary | ICD-10-CM

## 2021-03-14 DIAGNOSIS — M25551 Pain in right hip: Secondary | ICD-10-CM | POA: Diagnosis present

## 2021-03-14 LAB — CBC
HCT: 43.2 % (ref 39.0–52.0)
Hemoglobin: 15 g/dL (ref 13.0–17.0)
MCH: 32.3 pg (ref 26.0–34.0)
MCHC: 34.7 g/dL (ref 30.0–36.0)
MCV: 92.9 fL (ref 80.0–100.0)
Platelets: 258 10*3/uL (ref 150–400)
RBC: 4.65 MIL/uL (ref 4.22–5.81)
RDW: 12 % (ref 11.5–15.5)
WBC: 14.5 10*3/uL — ABNORMAL HIGH (ref 4.0–10.5)
nRBC: 0 % (ref 0.0–0.2)

## 2021-03-14 LAB — BASIC METABOLIC PANEL
Anion gap: 12 (ref 5–15)
BUN: 10 mg/dL (ref 6–20)
CO2: 22 mmol/L (ref 22–32)
Calcium: 8.9 mg/dL (ref 8.9–10.3)
Chloride: 105 mmol/L (ref 98–111)
Creatinine, Ser: 1.05 mg/dL (ref 0.61–1.24)
GFR, Estimated: >60 mL/min (ref >60)
Glucose, Bld: 144 mg/dL — ABNORMAL HIGH (ref 70–99)
Potassium: 3.8 mmol/L (ref 3.5–5.1)
Sodium: 139 mmol/L (ref 135–145)

## 2021-03-14 NOTE — ED Provider Notes (Signed)
[facility] Emergency Department Provider Note   ____________________________________________   Event Date/Time   First MD Initiated Contact with Patient 03/14/21 2357     (approximate)  I have reviewed the triage vital signs and the nursing notes.   HISTORY  Chief Complaint Leg Pain    HPI Ruben [name] is a 50 y.o. male brought to the ED via EMS from home with a chief complaint of nontraumatic right groin pain.  Patient reports onset of pain in the morning and progressive throughout the day.  Intensified when he tried to stand from a laying position and could not walk due to the pain.  Reports pain from his right groin radiating to right flank and down his leg.  States pain is so intense it is making him nauseated.  Denies fall/trauma/injury.  History of kidney stones but states this is not feel similarly.  History of varicose veins in 2020.  History of abscess to perineum in 2018 with indurated and fluctuant lesion to the right groin requiring I&D.  Denies associated fever, chills, cough, chest pain, shortness of breath, abdominal pain, vomiting, testicular pain or swelling, dysuria or diarrhea.  No STD concerns.      Past Medical History:  Diagnosis Date  . Anxiety   . Depression   . Influenza A     Patient Active Problem List   Diagnosis Date Noted  . Severe right groin pain 03/15/2021  . Influenza A 12/03/2016  . Hypotension 12/03/2016  . Tachycardia 12/03/2016  . Fever 12/03/2016    Past Surgical History:  Procedure Laterality Date  . CHOLECYSTECTOMY    . FRACTURE SURGERY    . NECK SURGERY    . TONSILLECTOMY      Prior to Admission medications   Medication Sig Start Date End Date Taking? Authorizing Provider  albuterol (VENTOLIN HFA) 108 (90 Base) MCG/ACT inhaler Inhale 2 puffs into the lungs every 6 (six) hours as needed for wheezing or shortness of breath. 11/23/19   Lorre Munroe, NP  ibuprofen (ADVIL) 600 MG tablet Take 1  tablet (600 mg total) by mouth every 6 (six) hours as needed. 06/05/20   Nita Sickle, MD  ondansetron (ZOFRAN ODT) 4 MG disintegrating tablet Take 1 tablet (4 mg total) by mouth every 8 (eight) hours as needed. 06/05/20   Nita Sickle, MD  traMADol (ULTRAM) 50 MG tablet Take 1 tablet (50 mg total) by mouth every 6 (six) hours as needed. 02/03/20   Faythe Ghee, PA-C    Allergies Patient has no known allergies.  Family History  Problem Relation Age of Onset  . Diabetes Mother   . Diabetes Father     Social History Social History   Tobacco Use  . Smoking status: Current Every Day Smoker    Packs/day: 0.75    Types: Cigarettes  . Smokeless tobacco: Never Used  Substance Use Topics  . Alcohol use: No    Comment: quit drinking and using illegal drugs in 2013  . Drug use: No    Review of Systems  Constitutional: No fever/chills Eyes: No visual changes. ENT: No sore throat. Cardiovascular: Denies chest pain. Respiratory: Denies shortness of breath. Gastrointestinal: No abdominal pain.  No nausea, no vomiting.  No diarrhea.  No constipation. Genitourinary: Positive for right groin pain.  Negative for dysuria. Musculoskeletal: Negative for back pain. Skin: Negative for rash. Neurological: Negative for headaches, focal weakness or numbness.   ____________________________________________   PHYSICAL EXAM:  VITAL SIGNS:  ED Triage Vitals  Enc Vitals Group     BP 03/14/21 2249 108/76     Pulse Rate 03/14/21 2249 88     Resp 03/14/21 2249 20     Temp 03/14/21 2249 99.3 F (37.4 C)     Temp Source 03/14/21 2249 Oral     SpO2 03/14/21 2242 96 %     Weight 03/14/21 2250 233 lb (105.7 kg)     Height 03/14/21 2250 6\' 2"  (1.88 m)     Head Circumference --      Peak Flow --      Pain Score 03/14/21 2250 10     Pain Loc --      Pain Edu? --      Excl. in GC? --     Constitutional: Alert and oriented. Well appearing and in mild to moderate acute distress. Eyes:  Conjunctivae are normal. PERRL. EOMI. Head: Atraumatic. Nose: No congestion/rhinnorhea. Mouth/Throat: Mucous membranes are moist.   Neck: No stridor.   Cardiovascular: Normal rate, regular rhythm. Grossly normal heart sounds.  Good peripheral circulation. Respiratory: Normal respiratory effort.  No retractions. Lungs CTAB. Gastrointestinal: Soft and nontender to light or deep palpation. No distention. No abdominal bruits. No CVA tenderness. Genitourinary: Right groin tender to palpation.  No lymphadenopathy.  No erythema, warmth, fluctuance or other skin changes.  No palpable mass.  Circumcised male.  No urethral discharge.  Bilaterally distended testicles which are nontender nonswollen.  Strong bilateral cremasteric reflexes. Musculoskeletal: No pain below right inguinal area.  No lower extremity tenderness nor edema.  No joint effusions. Neurologic:  Normal speech and language. No gross focal neurologic deficits are appreciated.  Skin:  Skin is warm, dry and intact. No rash noted. Psychiatric: Mood and affect are normal. Speech and behavior are normal.  ____________________________________________   LABS (all labs ordered are listed, but only abnormal results are displayed)  Labs Reviewed  BASIC METABOLIC PANEL - Abnormal; Notable for the following components:      Result Value   Glucose, Bld 144 (*)    All other components within normal limits  CBC - Abnormal; Notable for the following components:   WBC 14.5 (*)    All other components within normal limits  DIFFERENTIAL - Abnormal; Notable for the following components:   Neutro Abs 12.8 (*)    All other components within normal limits  URINALYSIS, COMPLETE (UACMP) WITH MICROSCOPIC - Abnormal; Notable for the following components:   Color, Urine YELLOW (*)    APPearance CLEAR (*)    All other components within normal limits  CULTURE, BLOOD (ROUTINE X 2)  CULTURE, BLOOD (ROUTINE X 2)  URINE CULTURE  RESP PANEL BY RT-PCR (FLU  A&B, COVID) ARPGX2  LACTIC ACID, PLASMA  HIV ANTIBODY (ROUTINE TESTING W REFLEX)  CBC  CREATININE, SERUM   ____________________________________________  EKG  None ____________________________________________  RADIOLOGY I, Teaghan Formica J, personally viewed and evaluated these images (plain radiographs) as part of my medical decision making, as well as reviewing the written report by the radiologist.  ED MD interpretation: No DVT, unremarkable CT abdomen/pelvis  Official radiology report(s): CT Abdomen Pelvis W Contrast  Result Date: 03/15/2021 CLINICAL DATA:  Right flank pain EXAM: CT ABDOMEN AND PELVIS WITH CONTRAST TECHNIQUE: Multidetector CT imaging of the abdomen and pelvis was performed using the standard protocol following bolus administration of intravenous contrast. CONTRAST:  05/15/2021 OMNIPAQUE IOHEXOL 300 MG/ML  SOLN COMPARISON:  None. FINDINGS: LOWER CHEST: Normal. HEPATOBILIARY: Normal hepatic contours. No intra- or extrahepatic  biliary dilatation. Status post cholecystectomy. PANCREAS: Normal pancreas. No ductal dilatation or peripancreatic fluid collection. SPLEEN: Normal. ADRENALS/URINARY TRACT: The adrenal glands are normal. Nonobstructive right nephrolithiasis measuring up to 4 mm. No left nephroureterolithiasis. No hydronephrosis. Normal urinary bladder. STOMACH/BOWEL: There is no hiatal hernia. Normal duodenal course and caliber. No small bowel dilatation or inflammation. No focal colonic abnormality. Normal appendix. VASCULAR/LYMPHATIC: Normal course and caliber of the major abdominal vessels. No abdominal or pelvic lymphadenopathy. REPRODUCTIVE: Normal prostate size with symmetric seminal vesicles. MUSCULOSKELETAL. No bony spinal canal stenosis or focal osseous abnormality. OTHER: None. IMPRESSION: 1. No acute abnormality of the abdomen or pelvis. 2. Nonobstructive right nephrolithiasis measuring up to 4 mm. Electronically Signed   By: Deatra Robinson M.D.   On: 03/15/2021 03:23    US Venous Img Lower Unilateral Right  Result Date: 03/15/2021 CLINICAL DATA:  Right leg swelling and pain EXAM: RIGHT LOWER EXTREMITY VENOUS DOPPLER ULTRASOUND TECHNIQUE: Gray-scale sonography with compression, as well as color and duplex ultrasound, were performed to evaluate the deep venous system(s) from the level of the common femoral vein through the popliteal and proximal calf veins. COMPARISON:  None. FINDINGS: VENOUS Normal compressibility of the common femoral, superficial femoral, and popliteal veins, as well as the visualized calf veins. Visualized portions of profunda femoral vein and great saphenous vein unremarkable. No filling defects to suggest DVT on grayscale or color Doppler imaging. Doppler waveforms show normal direction of venous flow, normal respiratory plasticity and response to augmentation. Limited views of the contralateral common femoral vein are unremarkable. OTHER None. Limitations: none IMPRESSION: Negative. Electronically Signed   By: Kreg Shropshire M.D.   On: 03/15/2021 01:45    ____________________________________________   PROCEDURES  Procedure(s) performed (including Critical Care):  .1-3 Lead EKG Interpretation Performed by: Irean Hong, MD Authorized by: Irean Hong, MD     Interpretation: normal     ECG rate:  76   ECG rate assessment: normal     Rhythm: sinus rhythm     Ectopy: none     Conduction: normal   Comments:     Patient placed on cardiac monitor to evaluate for arrhythmias    CRITICAL CARE Performed by: Irean Hong   Total critical care time: 30 minutes  Critical care time was exclusive of separately billable procedures and treating other patients.  Critical care was necessary to treat or prevent imminent or life-threatening deterioration.  Critical care was time spent personally by me on the following activities: development of treatment plan with patient and/or surrogate as well as nursing, discussions with consultants,  evaluation of patient's response to treatment, examination of patient, obtaining history from patient or surrogate, ordering and performing treatments and interventions, ordering and review of laboratory studies, ordering and review of radiographic studies, pulse oximetry and re-evaluation of patient's condition.  ____________________________________________   INITIAL IMPRESSION / ASSESSMENT AND PLAN / ED COURSE  As part of my medical decision making, I reviewed the following data within the electronic MEDICAL RECORD NUMBER Nursing notes reviewed and incorporated, Labs reviewed, Old chart reviewed, Radiograph reviewed and Notes from prior ED visits     50 year old male presenting with nontraumatic right groin pain.  Differential diagnosis includes but is not limited to infection, incarcerated inguinal hernia, femoral hernia, lymphadenopathy, kidney stone, UTI, varicose veins, DVT, musculoskeletal etiology, etc.  Laboratory results demonstrate moderate leukocytosis.  Will obtain imaging via ultrasound to evaluate DVT as well as CT abdomen/pelvis.  Initiate IV analgesia paired with IV antiemetic.  Will  reassess.  Clinical Course as of 03/15/21 0421  Tue Mar 15, 2021  0219 Patient asleep, awakened him for reexamination.  Updated him on negative ultrasound.  He was moved to a private room for a better and more thorough examination of the genital region.  Right groin without warmth, erythema or swelling.  Perineum without warmth, erythema or fluctuance.  Cord noted to right groin which is slightly painful; patient states that has been there for years.  No swelling, redness or tenderness to testicles or perineum.  Patient complains of returning pain after genital exam; will repeat IV analgesic.  Awaiting CT scan. [JS]  [phone] Awaken patient to update him on CT imaging results.  We discussed hospitalization for pain control.  Patient is willing to try to ambulate with a walker and see how he does.  We will perform  ambulation trial. [JS]  0403 Patient unable to get out of bed/stand despite assistance secondary to pain.  Will discuss with hospital services for admission. [JS]    Clinical Course User Index [JS] Irean HongSung, Taleisha Kaczynski J, MD     ____________________________________________   FINAL CLINICAL IMPRESSION(S) / ED DIAGNOSES  Final diagnoses:  Severe right groin pain     ED Discharge Orders    None      *Please note:  Ruben [name] was evaluated in Emergency Department on 03/15/2021 for the symptoms described in the history of present illness. He was evaluated in the context of the global COVID-19 pandemic, which necessitated consideration that the patient might be at risk for infection with the SARS-CoV-2 virus that causes COVID-19. Institutional protocols and algorithms that pertain to the evaluation of patients at risk for COVID-19 are in a state of rapid change based on information released by regulatory bodies including the CDC and federal and state organizations. These policies and algorithms were followed during the patient's care in the ED.  Some ED evaluations and interventions may be delayed as a result of limited staffing during and the pandemic.*   Note:  This document was prepared using Dragon voice recognition software and may include unintentional dictation errors.   Irean HongSung, Tommaso Cavitt J, MD 03/15/21 [id number]

## 2021-03-14 NOTE — ED Triage Notes (Signed)
Pt brought in via ems from home.  Pt has pain and swelling to right upper leg.  Pt states unable to ambulate.  No known injury to leg  Pt alert.

## 2021-03-14 NOTE — ED Triage Notes (Signed)
EMS brings pt in for c/o pain from rt groin radiating down leg with no known injury

## 2021-03-15 ENCOUNTER — Emergency Department: Payer: Self-pay

## 2021-03-15 ENCOUNTER — Observation Stay: Payer: Self-pay

## 2021-03-15 DIAGNOSIS — R1031 Right lower quadrant pain: Secondary | ICD-10-CM

## 2021-03-15 DIAGNOSIS — M25451 Effusion, right hip: Secondary | ICD-10-CM | POA: Diagnosis present

## 2021-03-15 LAB — URINALYSIS, COMPLETE (UACMP) WITH MICROSCOPIC
Bacteria, UA: NONE SEEN
Bilirubin Urine: NEGATIVE
Glucose, UA: NEGATIVE mg/dL
Hgb urine dipstick: NEGATIVE
Ketones, ur: NEGATIVE mg/dL
Leukocytes,Ua: NEGATIVE
Nitrite: NEGATIVE
Protein, ur: NEGATIVE mg/dL
Specific Gravity, Urine: 1.03 (ref 1.005–1.030)
Squamous Epithelial / HPF: NONE SEEN (ref 0–5)
pH: 5 (ref 5.0–8.0)

## 2021-03-15 LAB — DIFFERENTIAL
Abs Immature Granulocytes: 0.06 10*3/uL (ref 0.00–0.07)
Basophils Absolute: 0.1 10*3/uL (ref 0.0–0.1)
Basophils Relative: 0 %
Eosinophils Absolute: 0 10*3/uL (ref 0.0–0.5)
Eosinophils Relative: 0 %
Immature Granulocytes: 0 %
Lymphocytes Relative: 7 %
Lymphs Abs: 0.9 10*3/uL (ref 0.7–4.0)
Monocytes Absolute: 0.5 10*3/uL (ref 0.1–1.0)
Monocytes Relative: 4 %
Neutro Abs: 12.8 10*3/uL — ABNORMAL HIGH (ref 1.7–7.7)
Neutrophils Relative %: 89 %

## 2021-03-15 LAB — HIV ANTIBODY (ROUTINE TESTING W REFLEX): HIV Screen 4th Generation wRfx: NONREACTIVE

## 2021-03-15 LAB — RESP PANEL BY RT-PCR (FLU A&B, COVID) ARPGX2
Influenza A by PCR: NEGATIVE
Influenza B by PCR: NEGATIVE
SARS Coronavirus 2 by RT PCR: NEGATIVE

## 2021-03-15 LAB — LACTIC ACID, PLASMA: Lactic Acid, Venous: 1.3 mmol/L (ref 0.5–1.9)

## 2021-03-15 MED ORDER — OXYCODONE HCL 5 MG PO TABS
5.0000 mg | ORAL_TABLET | Freq: Four times a day (QID) | ORAL | Status: DC | PRN
  Administered 2021-03-16 – 2021-03-17 (×2): 5 mg via ORAL
  Filled 2021-03-15 (×2): qty 1

## 2021-03-15 MED ORDER — KETOROLAC TROMETHAMINE 30 MG/ML IJ SOLN
30.0000 mg | Freq: Four times a day (QID) | INTRAMUSCULAR | Status: DC | PRN
  Administered 2021-03-15: 30 mg via INTRAVENOUS
  Filled 2021-03-15: qty 1

## 2021-03-15 MED ORDER — ENOXAPARIN SODIUM 40 MG/0.4ML IJ SOSY: 40.0000 mg | PREFILLED_SYRINGE | INTRAMUSCULAR | Status: DC

## 2021-03-15 MED ORDER — ONDANSETRON HCL 4 MG PO TABS: 4.0000 mg | ORAL_TABLET | Freq: Four times a day (QID) | ORAL | Status: DC | PRN

## 2021-03-15 MED ORDER — HYDROMORPHONE HCL 1 MG/ML IJ SOLN
1.0000 mg | INTRAMUSCULAR | Status: DC | PRN
  Administered 2021-03-15 – 2021-03-17 (×7): 1 mg via INTRAVENOUS
  Filled 2021-03-15 (×7): qty 1

## 2021-03-15 MED ORDER — ONDANSETRON HCL 4 MG/2ML IJ SOLN
4.0000 mg | Freq: Four times a day (QID) | INTRAMUSCULAR | Status: DC | PRN
Start: 2021-03-15 — End: 2021-03-17

## 2021-03-15 MED ORDER — HYDROMORPHONE HCL 1 MG/ML IJ SOLN
1.0000 mg | Freq: Once | INTRAMUSCULAR | Status: AC
  Administered 2021-03-15: 1 mg via INTRAVENOUS
  Filled 2021-03-15: qty 1

## 2021-03-15 MED ORDER — ONDANSETRON HCL 4 MG/2ML IJ SOLN
4.0000 mg | Freq: Once | INTRAMUSCULAR | Status: AC
  Administered 2021-03-15: 4 mg via INTRAVENOUS
  Filled 2021-03-15: qty 2

## 2021-03-15 MED ORDER — IOHEXOL 9 MG/ML PO SOLN
500.0000 mL | ORAL | Status: AC
  Administered 2021-03-15 (×2): 500 mL via ORAL

## 2021-03-15 MED ORDER — KETOROLAC TROMETHAMINE 30 MG/ML IJ SOLN
15.0000 mg | Freq: Once | INTRAMUSCULAR | Status: AC
  Administered 2021-03-15: 15 mg via INTRAVENOUS
  Filled 2021-03-15: qty 1

## 2021-03-15 MED ORDER — ACETAMINOPHEN 325 MG PO TABS: 650.0000 mg | ORAL_TABLET | Freq: Four times a day (QID) | ORAL | Status: DC | PRN

## 2021-03-15 MED ORDER — IOHEXOL 300 MG/ML  SOLN
125.0000 mL | Freq: Once | INTRAMUSCULAR | Status: AC | PRN
  Administered 2021-03-15: 125 mL via INTRAVENOUS

## 2021-03-15 MED ORDER — METHOCARBAMOL 500 MG PO TABS: 500.0000 mg | ORAL_TABLET | Freq: Four times a day (QID) | ORAL | Status: DC | PRN

## 2021-03-15 MED ORDER — SODIUM CHLORIDE 0.9 % IV BOLUS
1000.0000 mL | Freq: Once | INTRAVENOUS | Status: AC
  Administered 2021-03-15: 1000 mL via INTRAVENOUS

## 2021-03-15 MED ORDER — ACETAMINOPHEN 650 MG RE SUPP: 650.0000 mg | Freq: Four times a day (QID) | RECTAL | Status: DC | PRN

## 2021-03-15 MED ORDER — KETOROLAC TROMETHAMINE 30 MG/ML IJ SOLN: 30.0000 mg | Freq: Four times a day (QID) | INTRAMUSCULAR | Status: DC | PRN

## 2021-03-15 NOTE — ED Notes (Signed)
Patient taken to ultrasound.

## 2021-03-15 NOTE — ED Notes (Signed)
Patient started oral contrast at this time.

## 2021-03-15 NOTE — ED Notes (Addendum)
Attempted to ambulate patient using walker, pt unable to bear weight on RLE. Dr. Dolores Frame notified. Sandwich tray provided.

## 2021-03-15 NOTE — Consult Note (Addendum)
Subjective:   CC: right groin pain  HPI:  Ruben [name] is a 50 y.o. male who was referred by Para March for evaluation of above cc.   Symptoms were first noted 1 day ago. Pain is severe, radiating down anterior thigh to knee.  No scrotal pain, change in urinary or BM changes. woke him up.  Associated with nausea secondary to pain, exacerbated by walking.  Non visible lump.    Past Medical History:  has a past medical history of Anxiety, Depression, and Influenza A.  Past Surgical History:  Past Surgical History:  Procedure Laterality Date  . CHOLECYSTECTOMY    . FRACTURE SURGERY    . NECK SURGERY    . TONSILLECTOMY      Family History: family history includes Diabetes in his father and mother.  Social History:  reports that he has been smoking cigarettes. He has been smoking about 0.75 packs per day. He has never used smokeless tobacco. He reports that he does not drink alcohol and does not use drugs.  Current Medications:  Prior to Admission medications   Not on File    Allergies:  Allergies as of 03/14/2021  . (No Known Allergies)    ROS:  General: Denies weight loss, weight gain, fatigue, fevers, chills, and night sweats. Eyes: Denies blurry vision, double vision, eye pain, itchy eyes, and tearing. Ears: Denies hearing loss, earache, and ringing in ears. Nose: Denies sinus pain, congestion, infections, runny nose, and nosebleeds. Mouth/throat: Denies hoarseness, sore throat, bleeding gums, and difficulty swallowing. Heart: Denies chest pain, palpitations, racing heart, irregular heartbeat, leg pain or swelling, and decreased activity tolerance. Respiratory: Denies breathing difficulty, shortness of breath, wheezing, cough, and sputum. GI: Denies change in appetite, heartburn, vomiting, constipation, diarrhea, and blood in stool. GU: Denies difficulty urinating, pain with urinating, urgency, frequency, blood in urine. Musculoskeletal: Denies joint stiffness, pain,  swelling, muscle weakness. Skin: Denies rash, itching, mass, tumors, sores, and boils Neurologic: Denies headache, fainting, dizziness, seizures, numbness, and tingling. Psychiatric: Denies depression, anxiety, difficulty sleeping, and memory loss. Endocrine: Denies heat or cold intolerance, and increased thirst or urination. Blood/lymph: Denies easy bruising, easy bruising, and swollen glands     Objective:     BP 119/79   Pulse 71   Temp 99.3 F (37.4 C) (Oral)   Resp 18   Ht 6\' 2"  (1.88 m)   Wt 105.7 kg   SpO2 96%   BMI 29.92 kg/m   Constitutional :  alert, cooperative, appears stated age and no distress  Lymphatics/Throat:  no asymmetry, masses, or scars  Respiratory:  clear to auscultation bilaterally  Cardiovascular:  regular rate and rhythm  Gastrointestinal: soft, non-tender; bowel sounds normal; no masses,  no organomegaly. Minor TTP in anterior thigh an groin area, with no obvious bulge or overlying skin changes.  Musculoskeletal: lying in bed, no obvious difficulty moving extremities at time of exam, but does complain of increasing pain with ambulation and external rotation of right hip  Skin: Cool and moist  Psychiatric: Normal affect, non-agitated, not confused       LABS:  CMP Latest Ref Rng & Units 03/14/2021 06/04/2020 01/15/2019  Glucose 70 - 99 mg/dL 03/17/2019) 161(W) 960(A)  BUN 6 - 20 mg/dL 10 11 14   Creatinine 0.61 - 1.24 mg/dL 540(J 8.11  Sodium 135 - 145 mmol/L 139 141 135  Potassium 3.5 - 5.1 mmol/L 3.8 4.1 3.9  Chloride 98 - 111 mmol/L 105 108 103  CO2 22 - 32  mmol/L 22 22 20(L)  Calcium 8.9 - 10.3 mg/dL 8.9 8.9 5.4(G)  Total Protein 6.5 - 8.1 g/dL - 7.0 6.9  Total Bilirubin 0.3 - 1.2 mg/dL - 0.9 1.0  Alkaline Phos 38 - 126 U/L - 52 56  AST 15 - 41 U/L - 15 17  ALT 0 - 44 U/L - 14 17   CBC Latest Ref Rng & Units 03/14/2021 06/04/2020 01/15/2019  WBC 4.0 - 10.5 K/uL 14.5(H) 13.0(H) 11.4(H)  Hemoglobin 13.0 - 17.0 g/dL 92.0 10.0 71.2  Hematocrit 39.0 -  52.0 % 43.2 42.1 49.0  Platelets 150 - 400 K/uL 258 237 256    RADS: CLINICAL DATA:  Right flank pain  EXAM: CT ABDOMEN AND PELVIS WITH CONTRAST  TECHNIQUE: Multidetector CT imaging of the abdomen and pelvis was performed using the standard protocol following bolus administration of intravenous contrast.  CONTRAST:  OMNIPAQUE IOHEXOL 300 MG/ML  SOLN  COMPARISON:  None.  FINDINGS: LOWER CHEST: Normal.  HEPATOBILIARY: Normal hepatic contours. No intra- or extrahepatic biliary dilatation. Status post cholecystectomy.  PANCREAS: Normal pancreas. No ductal dilatation or peripancreatic fluid collection.  SPLEEN: Normal.  ADRENALS/URINARY TRACT: The adrenal glands are normal. Nonobstructive right nephrolithiasis measuring up to 4 mm. No left nephroureterolithiasis. No hydronephrosis. Normal urinary bladder.  STOMACH/BOWEL: There is no hiatal hernia. Normal duodenal course and caliber. No small bowel dilatation or inflammation. No focal colonic abnormality. Normal appendix.  VASCULAR/LYMPHATIC: Normal course and caliber of the major abdominal vessels. No abdominal or pelvic lymphadenopathy.  REPRODUCTIVE: Normal prostate size with symmetric seminal vesicles.  MUSCULOSKELETAL. No bony spinal canal stenosis or focal osseous abnormality.  OTHER: None.  IMPRESSION: 1. No acute abnormality of the abdomen or pelvis. 2. Nonobstructive right nephrolithiasis measuring up to 4 mm.   Electronically Signed   By: Deatra Robinson M.D.   On: 03/15/2021 03:23 Assessment:   Right groin pain CT images personally reviewed by myself and agree with above report.  Plan:    more concerned of hip joint issues such as avascular necrosis.  recommended MRI, but patient refusing even with anxiety meds. I also stated I would recommend keeping him NPO in case he may require procedures to address his pain or cause of his pain.  Pt became irate despite my reasoning behind  the recommendation, and refused to be placed on NPO status.    He also said he will not agree to any form of surgical intervention even if it is life/limb saving.  I ended our conversation stating he can change his mind anytime and let us know if he will like to reconsider. He verbalized understanding.     Orthopedic consultation may benefit to see if other evaluations/treatments available within the limited parameters. Case discussed with hospitalist. Surgery to sign off.

## 2021-03-15 NOTE — Progress Notes (Signed)
PT Cancellation Note  Patient Details Name: Ruben [name] MRN: [id number] DOB: Jul 31, 1971   Cancelled Treatment:    Reason Eval/Treat Not Completed: Medical issues which prohibited therapy (Consult received, chart reviewed. Noted order for pending R hip CT. Will hold consult until test complete, results received and patient cleared for activity. Of note, surgery suggesting ortho consult; will continue to follow and initiate as appropriate)   Hubbert Landrigan H. Manson Passey, PT, DPT, NCS 03/15/21, 10:31 AM [phone]

## 2021-03-15 NOTE — H&P (Signed)
History and Physical    Ruben MAYSONET [name] MIW:[id number] DOB: 07-18-1971 DOA: 03/14/2021  PCP: Olena Leatherwood, FNP   Patient coming from: Home  I have personally briefly reviewed patient's old medical records in [facility]  Chief Complaint: Right groin pain  HPI: Ruben [name] is a 50 y.o. male with medical history significant for  cervical radiculopathy, and kidney stones presents with progressively worsening right groin pain of severe intensity, radiating to the right thigh associated with difficulty ambulating and weightbearing.   he has associated nausea but no vomiting.  No recent falls or other trauma.  He denies scrotal pain, dysuria, fever or chills or abdominal pain or diarrhea.  Reports has history of an abscess in the right groin in 2018 requiring I&D. ED course: Temperature 99.3, BP 108/76, pulse 88 respirations 20 with O2 sat 96% on room air.  Blood work significant for leukocytosis of 14,500 with normal lactic acid of.  Urinalysis unremarkable.  CBC and BMP otherwise Imaging: Venous Doppler left lower extremity negative for DVT CT abdomen and pelvis with contrast: No acute abnormality of the abdomen and pelvis.  Nonobstructive right nephrolithiasis measuring up to 4 mm  Patient was treated in the ER with Toradol and subsequently Dilaudid without significant relief.  Hospitalist consulted for admission.  Review of Systems: As per HPI otherwise all other systems on review of systems negative.    Past Medical History:  Diagnosis Date  . Anxiety   . Depression   . Influenza A     Past Surgical History:  Procedure Laterality Date  . CHOLECYSTECTOMY    . FRACTURE SURGERY    . NECK SURGERY    . TONSILLECTOMY       reports that he has been smoking cigarettes. He has been smoking about 0.75 packs per day. He has never used smokeless tobacco. He reports that he does not drink alcohol and does not use drugs.  No Known Allergies  Family History  Problem  Relation Age of Onset  . Diabetes Mother   . Diabetes Father       Prior to Admission medications   Medication Sig Start Date End Date Taking? Authorizing Provider  albuterol (VENTOLIN HFA) 108 (90 Base) MCG/ACT inhaler Inhale 2 puffs into the lungs every 6 (six) hours as needed for wheezing or shortness of breath. 11/23/19   Lorre Munroe, NP  ibuprofen (ADVIL) 600 MG tablet Take 1 tablet (600 mg total) by mouth every 6 (six) hours as needed. 06/05/20   Nita Sickle, MD  ondansetron (ZOFRAN ODT) 4 MG disintegrating tablet Take 1 tablet (4 mg total) by mouth every 8 (eight) hours as needed. 06/05/20   Nita Sickle, MD  traMADol (ULTRAM) 50 MG tablet Take 1 tablet (50 mg total) by mouth every 6 (six) hours as needed. 02/03/20   Faythe Ghee, PA-C    Physical Exam: Vitals:   03/14/21 2242 03/14/21 2249 03/14/21 2250 03/15/21 0225  BP:  108/76  122/81  Pulse:  88  76  Resp:  20  18  Temp:  99.3 F (37.4 C)    TempSrc:  Oral    SpO2: 96% 99%  96%  Weight:   105.7 kg   Height:   6\' 2"  (1.88 m)      Vitals:   03/14/21 2242 03/14/21 2249 03/14/21 2250 03/15/21 0225  BP:  108/76  122/81  Pulse:  88  76  Resp:  20  18  Temp:  99.3 F (37.4 C)    TempSrc:  Oral    SpO2: 96% 99%  96%  Weight:   105.7 kg   Height:   6\' 2"  (1.88 m)       Constitutional: Alert and oriented x 3 . Not in any apparent distress HEENT:      Head: Normocephalic and atraumatic.         Eyes: PERLA, EOMI, Conjunctivae are normal. Sclera is non-icteric.       Mouth/Throat: Mucous membranes are moist.       Neck: Supple with no signs of meningismus. Cardiovascular: Regular rate and rhythm. No murmurs, gallops, or rubs. 2+ symmetrical distal pulses are present . No JVD. No LE edema Respiratory: Respiratory effort normal .Lungs sounds clear bilaterally. No wheezes, crackles, or rhonchi.  Gastrointestinal: Soft, non tender, and non distended with positive bowel sounds.  Genitourinary: Tender in  right inguinal and femoral area on palpation but no redness or tenderness Musculoskeletal: Nontender with normal range of motion in all extremities. No cyanosis, or erythema of extremities. Neurologic:  Face is symmetric. Moving all extremities. No gross focal neurologic deficits . Skin: Skin is warm, dry.  No rash or ulcers Psychiatric: Mood and affect are normal    Labs on Admission: I have personally reviewed following labs and imaging studies  CBC: Recent Labs  Lab 03/14/21 2252  WBC 14.5*  NEUTROABS 12.8*  HGB 15.0  HCT 43.2  MCV 92.9  PLT 258   Basic Metabolic Panel: Recent Labs  Lab 03/14/21 2252  NA 139  K 3.8  CL 105  CO2 22  GLUCOSE 144*  BUN 10  CREATININE 1.05  CALCIUM 8.9   GFR: Estimated Creatinine Clearance: 111.5 mL/min (by C-G formula based on SCr of 1.05 mg/dL). Liver Function Tests: No results for input(s): AST, ALT, ALKPHOS, BILITOT, PROT, ALBUMIN in the last 168 hours. No results for input(s): LIPASE, AMYLASE in the last 168 hours. No results for input(s): AMMONIA in the last 168 hours. Coagulation Profile: No results for input(s): INR, PROTIME in the last 168 hours. Cardiac Enzymes: No results for input(s): CKTOTAL, CKMB, CKMBINDEX, TROPONINI in the last 168 hours. BNP (last 3 results) No results for input(s): PROBNP in the last 8760 hours. HbA1C: No results for input(s): HGBA1C in the last 72 hours. CBG: No results for input(s): GLUCAP in the last 168 hours. Lipid Profile: No results for input(s): CHOL, HDL, LDLCALC, TRIG, CHOLHDL, LDLDIRECT in the last 72 hours. Thyroid Function Tests: No results for input(s): TSH, T4TOTAL, FREET4, T3FREE, THYROIDAB in the last 72 hours. Anemia Panel: No results for input(s): VITAMINB12, FOLATE, FERRITIN, TIBC, IRON, RETICCTPCT in the last 72 hours. Urine analysis:    Component Value Date/Time   COLORURINE YELLOW (A) 03/15/2021 0155   APPEARANCEUR CLEAR (A) 03/15/2021 0155   APPEARANCEUR Hazy  12/15/2012 0820   LABSPEC 1.030 03/15/2021 0155   LABSPEC 1.018 12/15/2012 0820   PHURINE 5.0 03/15/2021 0155   GLUCOSEU NEGATIVE 03/15/2021 0155   GLUCOSEU Negative 12/15/2012 0820   HGBUR NEGATIVE 03/15/2021 0155   BILIRUBINUR NEGATIVE 03/15/2021 0155   BILIRUBINUR Negative 12/15/2012 0820   KETONESUR NEGATIVE 03/15/2021 0155   PROTEINUR NEGATIVE 03/15/2021 0155   NITRITE NEGATIVE 03/15/2021 0155   LEUKOCYTESUR NEGATIVE 03/15/2021 0155   LEUKOCYTESUR Negative 12/15/2012 0820    Radiological Exams on Admission: CT Abdomen Pelvis W Contrast  Result Date: 03/15/2021 CLINICAL DATA:  Right flank pain EXAM: CT ABDOMEN AND PELVIS WITH CONTRAST TECHNIQUE: Multidetector CT imaging of  the abdomen and pelvis was performed using the standard protocol following bolus administration of intravenous contrast. CONTRAST:  OMNIPAQUE IOHEXOL 300 MG/ML  SOLN COMPARISON:  None. FINDINGS: LOWER CHEST: Normal. HEPATOBILIARY: Normal hepatic contours. No intra- or extrahepatic biliary dilatation. Status post cholecystectomy. PANCREAS: Normal pancreas. No ductal dilatation or peripancreatic fluid collection. SPLEEN: Normal. ADRENALS/URINARY TRACT: The adrenal glands are normal. Nonobstructive right nephrolithiasis measuring up to 4 mm. No left nephroureterolithiasis. No hydronephrosis. Normal urinary bladder. STOMACH/BOWEL: There is no hiatal hernia. Normal duodenal course and caliber. No small bowel dilatation or inflammation. No focal colonic abnormality. Normal appendix. VASCULAR/LYMPHATIC: Normal course and caliber of the major abdominal vessels. No abdominal or pelvic lymphadenopathy. REPRODUCTIVE: Normal prostate size with symmetric seminal vesicles. MUSCULOSKELETAL. No bony spinal canal stenosis or focal osseous abnormality. OTHER: None. IMPRESSION: 1. No acute abnormality of the abdomen or pelvis. 2. Nonobstructive right nephrolithiasis measuring up to 4 mm. Electronically Signed   By: Deatra Robinson M.D.    On: 03/15/2021 03:23   US Venous Img Lower Unilateral Right  Result Date: 03/15/2021 CLINICAL DATA:  Right leg swelling and pain EXAM: RIGHT LOWER EXTREMITY VENOUS DOPPLER ULTRASOUND TECHNIQUE: Gray-scale sonography with compression, as well as color and duplex ultrasound, were performed to evaluate the deep venous system(s) from the level of the common femoral vein through the popliteal and proximal calf veins. COMPARISON:  None. FINDINGS: VENOUS Normal compressibility of the common femoral, superficial femoral, and popliteal veins, as well as the visualized calf veins. Visualized portions of profunda femoral vein and great saphenous vein unremarkable. No filling defects to suggest DVT on grayscale or color Doppler imaging. Doppler waveforms show normal direction of venous flow, normal respiratory plasticity and response to augmentation. Limited views of the contralateral common femoral vein are unremarkable. OTHER None. Limitations: none IMPRESSION: Negative. Electronically Signed   By: Kreg Shropshire M.D.   On: 03/15/2021 01:45     Assessment/Plan 50 year old male with medical history significant for  cervical radiculopathy, and kidney stones presents with one day history progressively worsening right groin pain of severe intensity, radiating to the right thigh .    Severe right groin pain - suspect occult hernia. No scrotal pain - CT abdomen and pelvis unremarkable except for right nonobstructing nephrolithiasis with no osseous abnormality -right Lower extremity Doppler negative - Patient had low-grade temperature and leukocytosis of 14,500 - Pain control with Toradol and narcotics -surgical consult to evaluate     DVT prophylaxis: Lovenox  Code Status: full code  Family Communication:  none  Disposition Plan: Back to previous home environment Consults called: surgical  Status:observation    Andris Baumann MD [facility]     03/15/2021, 4:14 AM

## 2021-03-15 NOTE — Progress Notes (Signed)
PROGRESS NOTE    Ruben [name]  AOZ:[id number] DOB: 06-Jun-1971 DOA: 03/14/2021 PCP: Romualdo Bolk, FNP    Brief Narrative:  Ruben [name] is a 49 year old male with past medical history significant for cervical radiculopathy, history of nephrolithiasis who presented to [facility] ED on 5/2 with worsening right groin pain rating to right thigh with associated difficulty ambulating and weightbearing to right lower extremity.  Patient reports associated nausea without vomiting.  No recent falls or trauma.  Denies scrotal pain, no dysuria, no fever/chills, no abdominal pain or diarrhea.  Reports history of abscess right groin 2018 requiring I&D.  In the ED, temperature 99.3 F, HR 88, RR 20, BP 108/76, SPO2 99% on room air.  Sodium 139, potassium 3.8, chloride 105, CO2 22, glucose 144, BUN 10, creatinine 1.05.  Lactic acid 1.3, WBC 14.5, hemoglobin 15.0, platelets 258.  Lactic acid 1.3.  Urinalysis unrevealing.  COVID-19 PCR negative.  Influenza A/B PCR negative.  Vascular duplex ultrasound right lower extremity negative for DVT.  CT abdomen/pelvis with no acute abnormality of the pelvis or abdomen, noted nonobstructive right nephrolithiasis measuring 4 mm.  General surgery was consulted.  Hospital service consulted for further evaluation and management.   Assessment & Plan:   Active Problems:   Severe right groin pain   Right hip/groin pain Patient presenting to the ED with progressive right hip/groin pain over the past few days with inability to walk/bear weight.  No recent trauma.  Reported history of previous right groin abscess s/p I&D 2018.  Patient is afebrile.  WBC count elevated 14.5.  Vascular duplex ultrasound negative for DVT.  CT abdomen/pelvis unrevealing other than nonobstructing right nephrolithiasis.  Seen by general surgery on 5/3, Dr. Lysle Pearl believes his pain is more related to hip joint issues such as avascular necrosis and recommended MRI and orthopedics consultation.  Patient  stated does not agree to any type of surgical intervention even if it were life or limb saving and declines MRI.  Had CT reformats CT abdomen/pelvis images to include right hip with findings of right hip joint effusion of indeterminate cause with no superimposed fracture or erosion. --Will hold off on orthopedic consultation at present given patient declines any possible surgical intervention or procedure -- Continues to decline MRI due to anxiety --Oxycodone 5 mg p.o. every 6 hours moderate pain --Toradol 30 mg IV every 6 hours as needed severe pain --Dilaudid 1 mg IV every 2 hours as needed severe breakthrough pain --Robaxin 500 mg p.o. every 6 hours as needed muscle spasms --Check ESR/CRP -- PT/OT evaluation: Pending   DVT prophylaxis: Lovenox   Code Status: Full Code Family Communication: No family present at bedside this morning  Disposition Plan:  Level of care: Med-Surg Status is: Observation  The patient remains OBS appropriate and will d/c before 2 midnights.  Dispo: The patient is from: Home              Anticipated d/c is to: Home              Patient currently is not medically stable to d/c.   Difficult to place patient No   Consultants:   General surgery, Dr. Lysle Pearl -signed off 5/3  Procedures:   None  Antimicrobials:   None   Subjective: Patient seen and examined bedside, resting comfortably.  Continues with right hip pain.  Slightly improved since admission.  Seen by general surgery this morning with recommendations of MRI and orthopedics evaluation, patient declines.  No  other specific complaints or concerns at this time.  RN present at bedside.  Patient denies headache, no chest pain, no palpitations, no shortness of breath, no abdominal pain, no dysuria, no fever/chills/night sweats, no nausea/vomiting/diarrhea, no paresthesias.  No acute events overnight per nursing staff.  Objective: Vitals:   03/15/21 0225 03/15/21 0430 03/15/21 1006 03/15/21 1112   BP: 122/81 119/79 120/70 (!) 122/91  Pulse: 76 71 70 65  Resp: $Remo'18 18 20 16  'QBGfS$ Temp:   98.5 F (36.9 C) 97.7 F (36.5 C)  TempSrc:    Oral  SpO2: 96% 96% 98% 97%  Weight:      Height:        Intake/Output Summary (Last 24 hours) at 03/15/2021 1509 Last data filed at 03/15/2021 1010 Gross per 24 hour  Intake 1240 ml  Output --  Net 1240 ml   Filed Weights   03/14/21 2250  Weight: 105.7 kg    Examination:  General exam: Appears calm and comfortable, appears older than stated age Respiratory system: Clear to auscultation. Respiratory effort normal.  On room air Cardiovascular system: S1 & S2 heard, RRR. No JVD, murmurs, rubs, gallops or clicks. No pedal edema. Gastrointestinal system: Abdomen is nondistended, soft and nontender. No organomegaly or masses felt. Normal bowel sounds heard. Central nervous system: Alert and oriented. No focal neurological deficits. Extremities: Right hip with pain on active/passive range of motion with decreased strength likely secondary to effort/pain.  Neurovascularly intact, sensation intact to light touch Skin: No rashes, lesions or ulcers Psychiatry: Judgement and insight appear poor. Mood & affect appropriate.     Data Reviewed: I have personally reviewed following labs and imaging studies  CBC: Recent Labs  Lab 03/14/21 2252  WBC 14.5*  NEUTROABS 12.8*  HGB 15.0  HCT 43.2  MCV 92.9  PLT 536   Basic Metabolic Panel: Recent Labs  Lab 03/14/21 2252  NA 139  K 3.8  CL 105  CO2 22  GLUCOSE 144*  BUN 10  CREATININE 1.05  CALCIUM 8.9   GFR: Estimated Creatinine Clearance: 111.5 mL/min (by C-G formula based on SCr of 1.05 mg/dL). Liver Function Tests: No results for input(s): AST, ALT, ALKPHOS, BILITOT, PROT, ALBUMIN in the last 168 hours. No results for input(s): LIPASE, AMYLASE in the last 168 hours. No results for input(s): AMMONIA in the last 168 hours. Coagulation Profile: No results for input(s): INR, PROTIME in the last  168 hours. Cardiac Enzymes: No results for input(s): CKTOTAL, CKMB, CKMBINDEX, TROPONINI in the last 168 hours. BNP (last 3 results) No results for input(s): PROBNP in the last 8760 hours. HbA1C: No results for input(s): HGBA1C in the last 72 hours. CBG: No results for input(s): GLUCAP in the last 168 hours. Lipid Profile: No results for input(s): CHOL, HDL, LDLCALC, TRIG, CHOLHDL, LDLDIRECT in the last 72 hours. Thyroid Function Tests: No results for input(s): TSH, T4TOTAL, FREET4, T3FREE, THYROIDAB in the last 72 hours. Anemia Panel: No results for input(s): VITAMINB12, FOLATE, FERRITIN, TIBC, IRON, RETICCTPCT in the last 72 hours. Sepsis Labs: Recent Labs  Lab 03/15/21 0154  LATICACIDVEN 1.3    Recent Results (from the past 240 hour(s))  Culture, blood (routine x 2)     Status: None (Preliminary result)   Collection Time: 03/15/21  1:54 AM   Specimen: BLOOD  Result Value Ref Range Status   Specimen Description BLOOD RIGHT ANTECUBITAL  Final   Special Requests   Final    BOTTLES DRAWN AEROBIC AND ANAEROBIC Blood Culture adequate  volume   Culture   Final    NO GROWTH <12 HOURS Performed at [facility], [address] [place], [place] [address]    Report Status PENDING  Incomplete  Culture, blood (routine x 2)     Status: None (Preliminary result)   Collection Time: 03/15/21  1:55 AM   Specimen: BLOOD  Result Value Ref Range Status   Specimen Description BLOOD BLOOD RIGHT FOREARM  Final   Special Requests   Final    BOTTLES DRAWN AEROBIC AND ANAEROBIC Blood Culture adequate volume   Culture   Final    NO GROWTH <12 HOURS Performed at [facility], [address], [place], [place] [address]    Report Status PENDING  Incomplete  Resp Panel by RT-PCR (Flu A&B, Covid) Nasopharyngeal Swab     Status: None   Collection Time: 03/15/21  3:48 AM   Specimen: Nasopharyngeal Swab; Nasopharyngeal(NP) swabs in vial transport medium  Result Value Ref Range  Status   SARS Coronavirus 2 by RT PCR NEGATIVE NEGATIVE Final    Comment: (NOTE) SARS-CoV-2 target nucleic acids are NOT DETECTED.  The SARS-CoV-2 RNA is generally detectable in upper respiratory specimens during the acute phase of infection. The lowest concentration of SARS-CoV-2 viral copies this assay can detect is 138 copies/mL. A negative result does not preclude SARS-Cov-2 infection and should not be used as the sole basis for treatment or other patient management decisions. A negative result may occur with  improper specimen collection/handling, submission of specimen other than nasopharyngeal swab, presence of viral mutation(s) within the areas targeted by this assay, and inadequate number of viral copies(<138 copies/mL). A negative result must be combined with clinical observations, patient history, and epidemiological information. The expected result is Negative.  Fact Sheet for Patients:  EntrepreneurPulse.com.au  Fact Sheet for Healthcare Providers:  IncredibleEmployment.be  This test is no t yet approved or cleared by the [place] FDA and  has been authorized for detection and/or diagnosis of SARS-CoV-2 by FDA under an Emergency Use Authorization (EUA). This EUA will remain  in effect (meaning this test can be used) for the duration of the COVID-19 declaration under Section 564(b)(1) of the Act, 21 U.S.C.section 360bbb-3(b)(1), unless the authorization is terminated  or revoked sooner.       Influenza A by PCR NEGATIVE NEGATIVE Final   Influenza B by PCR NEGATIVE NEGATIVE Final    Comment: (NOTE) The Xpert Xpress SARS-CoV-2/FLU/RSV plus assay is intended as an aid in the diagnosis of influenza from Nasopharyngeal swab specimens and should not be used as a sole basis for treatment. Nasal washings and aspirates are unacceptable for Xpert Xpress SARS-CoV-2/FLU/RSV testing.  Fact Sheet for  Patients: EntrepreneurPulse.com.au  Fact Sheet for Healthcare Providers: IncredibleEmployment.be  This test is not yet approved or cleared by the [place] FDA and has been authorized for detection and/or diagnosis of SARS-CoV-2 by FDA under an Emergency Use Authorization (EUA). This EUA will remain in effect (meaning this test can be used) for the duration of the COVID-19 declaration under Section 564(b)(1) of the Act, 21 U.S.C. section 360bbb-3(b)(1), unless the authorization is terminated or revoked.  Performed at [facility], [address] [place], [place] [address]          Radiology Studies: CT Abdomen Pelvis W Contrast  Result Date: 03/15/2021 CLINICAL DATA:  Right flank pain EXAM: CT ABDOMEN AND PELVIS WITH CONTRAST TECHNIQUE: Multidetector CT imaging of the abdomen and pelvis was performed using the standard  protocol following bolus administration of intravenous contrast. CONTRAST:  120mL OMNIPAQUE IOHEXOL 300 MG/ML  SOLN COMPARISON:  None. FINDINGS: LOWER CHEST: Normal. HEPATOBILIARY: Normal hepatic contours. No intra- or extrahepatic biliary dilatation. Status post cholecystectomy. PANCREAS: Normal pancreas. No ductal dilatation or peripancreatic fluid collection. SPLEEN: Normal. ADRENALS/URINARY TRACT: The adrenal glands are normal. Nonobstructive right nephrolithiasis measuring up to 4 mm. No left nephroureterolithiasis. No hydronephrosis. Normal urinary bladder. STOMACH/BOWEL: There is no hiatal hernia. Normal duodenal course and caliber. No small bowel dilatation or inflammation. No focal colonic abnormality. Normal appendix. VASCULAR/LYMPHATIC: Normal course and caliber of the major abdominal vessels. No abdominal or pelvic lymphadenopathy. REPRODUCTIVE: Normal prostate size with symmetric seminal vesicles. MUSCULOSKELETAL. No bony spinal canal stenosis or focal osseous abnormality. OTHER: None. IMPRESSION: 1. No acute  abnormality of the abdomen or pelvis. 2. Nonobstructive right nephrolithiasis measuring up to 4 mm. Electronically Signed   By: Ulyses Jarred M.D.   On: 03/15/2021 03:23   US Venous Img Lower Unilateral Right  Result Date: 03/15/2021 CLINICAL DATA:  Right leg swelling and pain EXAM: RIGHT LOWER EXTREMITY VENOUS DOPPLER ULTRASOUND TECHNIQUE: Gray-scale sonography with compression, as well as color and duplex ultrasound, were performed to evaluate the deep venous system(s) from the level of the common femoral vein through the popliteal and proximal calf veins. COMPARISON:  None. FINDINGS: VENOUS Normal compressibility of the common femoral, superficial femoral, and popliteal veins, as well as the visualized calf veins. Visualized portions of profunda femoral vein and great saphenous vein unremarkable. No filling defects to suggest DVT on grayscale or color Doppler imaging. Doppler waveforms show normal direction of venous flow, normal respiratory plasticity and response to augmentation. Limited views of the contralateral common femoral vein are unremarkable. OTHER None. Limitations: none IMPRESSION: Negative. Electronically Signed   By: Lovena Le M.D.   On: 03/15/2021 01:45   CT NO CHARGE  Result Date: 03/15/2021 CLINICAL DATA:  Hip pain with osteonecrosis suspected EXAM: CT ADDITIONAL VIEWS AT NO CHARGE TECHNIQUE: Reformatted images of the right hip were generated from a CT of the abdomen and pelvis with contrast. CONTRAST:  None additional COMPARISON:  None. FINDINGS: Right hip joint effusion. No underlying fracture, or erosion, or subchondral lucency. No signs of osteonecrosis. No evidence of skin or muscular inflammation. No significant degenerative hip narrowing or spurring. Mild sacroiliac degenerative spurring. IMPRESSION: Right hip joint effusion of indeterminate cause. No superimposed fracture or erosion. Electronically Signed   By: Monte Fantasia M.D.   On: 03/15/2021 11:09        Scheduled  Meds: . enoxaparin (LOVENOX) injection  40 mg Subcutaneous Q24H   Continuous Infusions:   LOS: 0 days    Time spent: 39 minutes spent on chart review, discussion with nursing staff, consultants, updating family and interview/physical exam; more than 50% of that time was spent in counseling and/or coordination of care.    Trenity Pha J [place] DO [facility] Available via Epic secure chat 7am-7pm After these hours, please refer to coverage provider listed on [email or web address] 03/15/2021, 3:09 PM

## 2021-03-15 NOTE — Progress Notes (Signed)
OT Cancellation Note  Patient Details Name: Ruben [name] MRN: [id number] DOB: 1971/10/30   Cancelled Treatment:    Reason Eval/Treat Not Completed: Other (comment). Consult received, chart reviewed. Noted order for pending R hip CT. Will hold consult until test complete, results received and patient cleared for activity. Of note, surgery suggesting ortho consult; will continue to follow and initiate as appropriate.  Wynona Canes, MPH, MS, OTR/L ascom [phone] 03/15/21, 10:52 AM

## 2021-03-16 ENCOUNTER — Observation Stay: Payer: Self-pay

## 2021-03-16 DIAGNOSIS — M25551 Pain in right hip: Secondary | ICD-10-CM | POA: Diagnosis present

## 2021-03-16 LAB — CBC
HCT: 37.4 % — ABNORMAL LOW (ref 39.0–52.0)
Hemoglobin: 12.7 g/dL — ABNORMAL LOW (ref 13.0–17.0)
MCH: 32.2 pg (ref 26.0–34.0)
MCHC: 34 g/dL (ref 30.0–36.0)
MCV: 94.9 fL (ref 80.0–100.0)
Platelets: 197 10*3/uL (ref 150–400)
RBC: 3.94 MIL/uL — ABNORMAL LOW (ref 4.22–5.81)
RDW: 12 % (ref 11.5–15.5)
WBC: 9.8 10*3/uL (ref 4.0–10.5)
nRBC: 0 % (ref 0.0–0.2)

## 2021-03-16 LAB — URINE CULTURE: Culture: NO GROWTH

## 2021-03-16 LAB — BASIC METABOLIC PANEL
Anion gap: 6 (ref 5–15)
BUN: 9 mg/dL (ref 6–20)
CO2: 27 mmol/L (ref 22–32)
Calcium: 8.1 mg/dL — ABNORMAL LOW (ref 8.9–10.3)
Chloride: 103 mmol/L (ref 98–111)
Creatinine, Ser: 0.94 mg/dL (ref 0.61–1.24)
GFR, Estimated: >60 mL/min (ref >60)
Glucose, Bld: 134 mg/dL — ABNORMAL HIGH (ref 70–99)
Potassium: 3.5 mmol/L (ref 3.5–5.1)
Sodium: 136 mmol/L (ref 135–145)

## 2021-03-16 LAB — SEDIMENTATION RATE: Sed Rate: 31 mm/hr — ABNORMAL HIGH (ref 0–15)

## 2021-03-16 LAB — C-REACTIVE PROTEIN: CRP: 11.6 mg/dL — ABNORMAL HIGH (ref <1.0)

## 2021-03-16 MED ORDER — ALUM & MAG HYDROXIDE-SIMETH 200-200-20 MG/5ML PO SUSP
30.0000 mL | Freq: Four times a day (QID) | ORAL | Status: DC | PRN
  Administered 2021-03-16: 30 mL via ORAL
  Filled 2021-03-16: qty 30

## 2021-03-16 NOTE — Evaluation (Signed)
Physical Therapy Evaluation Patient Details Name: Ruben [name] MRN: [id number] DOB: [birth year] Today's Date: 03/16/2021   History of Present Illness  presented ot ER secondary to acute-onset R groin pain, difficulty WBing and ambulating; admitted for pain management and medical work up of R groin pain.  CT abdomen/pelvic negative for acute pathology (surgery recommending ortho consult-pending-for consideration of hip pathology); patient currently refusing MRI to evaluate further.  Clinical Impression  Patient seated edge of bed upon arrival to session; RN at bedside for medication administration.  Alert and oriented; follows commands and agreeable to mobility efforts as pain allows.  Rates R hip pain 8/10 despite medication, reporting pain exacerbated with WBing and active movement efforts.  Localizes pain to medial groin area, but spreads throughout hip joint with mobility and WBing.  Not point tender over any specific site.  Significant limitations in R hip ROM (in isolation and with functional activities) and strength (2+/5) due to pain with any/all movement.  Able to complete sit/stand, basic transfers and gait (50') with RW, cga/close sup.  Demonstrates 3-point, step to gait pattern with very heavy WBing bilat UEs, very limited WBing/loading R LE (approx 10-20% body weight); tends to vault on L LE to advance R LE and relies on very passive swing phase of R LE. Unsafe/unable to attempt without RW at this time due to pain and associated risk of falling. Would benefit from skilled PT to address above deficits and promote optimal return to PLOF.; Recommend transition to HHPT upon discharge from acute hospitalization.     Follow Up Recommendations Home health PT    Equipment Recommendations  Rolling walker with 5" wheels;3in1 (PT)    Recommendations for Other Services       Precautions / Restrictions Precautions Precautions: None Restrictions Weight Bearing Restrictions: No       Mobility  Bed Mobility               General bed mobility comments: seated edge of bed beginning/end of treatment session    Transfers Overall transfer level: Needs assistance Equipment used: Rolling walker (2 wheeled) Transfers: Sit to/from Stand Sit to Stand: Min assist         General transfer comment: heavy use of UEs to complete; very minimal use of R LE with movement transition  Ambulation/Gait Ambulation/Gait assistance: Min guard;Supervision Gait Distance (Feet): 50 Feet Assistive device: Rolling walker (2 wheeled)       General Gait Details: 3-point, step to gait pattern with very heavy WBing bilat UEs, very limited WBing/loading R LE (approx 10-20% body weight); tends to vault on L LE to advance R LE and relies on very passive swing phase of R LE  Stairs            Wheelchair Mobility    Modified Rankin (Stroke Patients Only)       Balance Overall balance assessment: Needs assistance Sitting-balance support: No upper extremity supported;Feet supported Sitting balance-Leahy Scale: Good     Standing balance support: Bilateral upper extremity supported Standing balance-Leahy Scale: Fair                               Pertinent Vitals/Pain Pain Assessment: 0-10 Pain Score: 8  Pain Location: R hip/groin Pain Descriptors / Indicators: Aching;Grimacing;Guarding Pain Intervention(s): Limited activity within patient's tolerance;Monitored during session;Premedicated before session;Repositioned    Home Living Family/patient expects to be discharged to:: Private residence Living Arrangements: Spouse/significant other;Children  Type of Home: Apartment Home Access: Stairs to enter   Entrance Stairs-Number of Steps: 7-8 in front, 1 in back Home Layout: One level Home Equipment: None      Prior Function Level of Independence: Independent         Comments: Indep with good ADLs, household and community mobilization without  assist device; denies recent fall history, R hip injury; + driving     Hand Dominance        Extremity/Trunk Assessment   Upper Extremity Assessment Upper Extremity Assessment: Overall WFL for tasks assessed    Lower Extremity Assessment Lower Extremity Assessment:  (R hip flexion limited approx 75 degrees, poor tolerance for IR/ER (all limited by pain); R hip strength grossly 2+/5)       Communication   Communication: No difficulties  Cognition Arousal/Alertness: Awake/alert Behavior During Therapy: WFL for tasks assessed/performed Overall Cognitive Status: Within Functional Limits for tasks assessed                                        General Comments      Exercises     Assessment/Plan    PT Assessment Patient needs continued PT services  PT Problem List Decreased strength;Decreased range of motion;Decreased balance;Decreased activity tolerance;Decreased mobility;Decreased knowledge of use of DME;Decreased safety awareness;Decreased knowledge of precautions;Pain       PT Treatment Interventions DME instruction;Gait training;Stair training;Functional mobility training;Therapeutic activities;Therapeutic exercise;Balance training;Patient/family education    PT Goals (Current goals can be found in the Care Plan section)  Acute Rehab PT Goals Patient Stated Goal: to figure out what's going on and fix this pain PT Goal Formulation: With patient Time For Goal Achievement: 03/30/21 Potential to Achieve Goals: Good    Frequency Min 2X/week   Barriers to discharge        Co-evaluation               AM-PAC PT "6 Clicks" Mobility  Outcome Measure Help needed turning from your back to your side while in a flat bed without using bedrails?: None Help needed moving from lying on your back to sitting on the side of a flat bed without using bedrails?: None Help needed moving to and from a bed to a chair (including a wheelchair)?: A Little Help  needed standing up from a chair using your arms (e.g., wheelchair or bedside chair)?: A Little Help needed to walk in hospital room?: A Little Help needed climbing 3-5 steps with a railing? : A Little 6 Click Score: 20    End of Session Equipment Utilized During Treatment: Gait belt Activity Tolerance: Patient tolerated treatment well Patient left: in bed;with call bell/phone within reach Nurse Communication: Mobility status PT Visit Diagnosis: Muscle weakness (generalized) (M62.81);Pain Pain - Right/Left: Right Pain - part of body: Hip    Time: 8453-6468 PT Time Calculation (min) (ACUTE ONLY): 18 min   Charges:   PT Evaluation $PT Eval Moderate Complexity: 1 Mod          Ishmel Acevedo H. Manson Passey, PT, DPT, NCS 03/16/21, 10:25 AM [phone]

## 2021-03-16 NOTE — Consult Note (Addendum)
ORTHOPAEDIC CONSULTATION  REQUESTING PHYSICIAN: Glade Lloyd, MD  Chief Complaint: right hip pain  HPI: Ruben [name] is a 50 y.o. male who complains of right hip pain for the past few days. The pain is sharp in character. The pain is severe and as high as 10/10. The pain is worse with movement and better with rest. Denies any numbness, tingling or constitutional symptoms.  Past Medical History:  Diagnosis Date  . Anxiety   . Depression   . Influenza A    Past Surgical History:  Procedure Laterality Date  . CHOLECYSTECTOMY    . FRACTURE SURGERY    . NECK SURGERY    . TONSILLECTOMY     Social History   Socioeconomic History  . Marital status: Single    Spouse name: Not on file  . Number of children: Not on file  . Years of education: Not on file  . Highest education level: Not on file  Occupational History  . Not on file  Tobacco Use  . Smoking status: Current Every Day Smoker    Packs/day: 0.75    Types: Cigarettes  . Smokeless tobacco: Never Used  Substance and Sexual Activity  . Alcohol use: No    Comment: quit drinking and using illegal drugs in 2013  . Drug use: No  . Sexual activity: Not on file  Other Topics Concern  . Not on file  Social History Narrative  . Not on file   Social Determinants of Health   Financial Resource Strain: Not on file  Food Insecurity: Not on file  Transportation Needs: Not on file  Physical Activity: Not on file  Stress: Not on file  Social Connections: Not on file   Family History  Problem Relation Age of Onset  . Diabetes Mother   . Diabetes Father    No Known Allergies Prior to Admission medications   Not on File   CT Abdomen Pelvis W Contrast  Result Date: 03/15/2021 CLINICAL DATA:  Right flank pain EXAM: CT ABDOMEN AND PELVIS WITH CONTRAST TECHNIQUE: Multidetector CT imaging of the abdomen and pelvis was performed using the standard protocol following bolus administration of intravenous contrast. CONTRAST:   OMNIPAQUE IOHEXOL 300 MG/ML  SOLN COMPARISON:  None. FINDINGS: LOWER CHEST: Normal. HEPATOBILIARY: Normal hepatic contours. No intra- or extrahepatic biliary dilatation. Status post cholecystectomy. PANCREAS: Normal pancreas. No ductal dilatation or peripancreatic fluid collection. SPLEEN: Normal. ADRENALS/URINARY TRACT: The adrenal glands are normal. Nonobstructive right nephrolithiasis measuring up to 4 mm. No left nephroureterolithiasis. No hydronephrosis. Normal urinary bladder. STOMACH/BOWEL: There is no hiatal hernia. Normal duodenal course and caliber. No small bowel dilatation or inflammation. No focal colonic abnormality. Normal appendix. VASCULAR/LYMPHATIC: Normal course and caliber of the major abdominal vessels. No abdominal or pelvic lymphadenopathy. REPRODUCTIVE: Normal prostate size with symmetric seminal vesicles. MUSCULOSKELETAL. No bony spinal canal stenosis or focal osseous abnormality. OTHER: None. IMPRESSION: 1. No acute abnormality of the abdomen or pelvis. 2. Nonobstructive right nephrolithiasis measuring up to 4 mm. Electronically Signed   By: Deatra Robinson M.D.   On: 03/15/2021 03:23   US Venous Img Lower Unilateral Right  Result Date: 03/15/2021 CLINICAL DATA:  Right leg swelling and pain EXAM: RIGHT LOWER EXTREMITY VENOUS DOPPLER ULTRASOUND TECHNIQUE: Gray-scale sonography with compression, as well as color and duplex ultrasound, were performed to evaluate the deep venous system(s) from the level of the common femoral vein through the popliteal and proximal calf veins. COMPARISON:  None. FINDINGS: VENOUS Normal compressibility of the  common femoral, superficial femoral, and popliteal veins, as well as the visualized calf veins. Visualized portions of profunda femoral vein and great saphenous vein unremarkable. No filling defects to suggest DVT on grayscale or color Doppler imaging. Doppler waveforms show normal direction of venous flow, normal respiratory plasticity and response  to augmentation. Limited views of the contralateral common femoral vein are unremarkable. OTHER None. Limitations: none IMPRESSION: Negative. Electronically Signed   By: Kreg Shropshire M.D.   On: 03/15/2021 01:45   CT NO CHARGE  Result Date: 03/15/2021 CLINICAL DATA:  Hip pain with osteonecrosis suspected EXAM: CT ADDITIONAL VIEWS AT NO CHARGE TECHNIQUE: Reformatted images of the right hip were generated from a CT of the abdomen and pelvis with contrast. CONTRAST:  None additional COMPARISON:  None. FINDINGS: Right hip joint effusion. No underlying fracture, or erosion, or subchondral lucency. No signs of osteonecrosis. No evidence of skin or muscular inflammation. No significant degenerative hip narrowing or spurring. Mild sacroiliac degenerative spurring. IMPRESSION: Right hip joint effusion of indeterminate cause. No superimposed fracture or erosion. Electronically Signed   By: Marnee Spring M.D.   On: 03/15/2021 11:09   DG HIP UNILAT WITH PELVIS 2-3 VIEWS RIGHT  Result Date: 03/16/2021 CLINICAL DATA:  Right hip pain. EXAM: DG HIP (WITH OR WITHOUT PELVIS) 2-3V RIGHT COMPARISON:  CT 03/15/2021, 06/05/2020. FINDINGS: Degenerative change both hips. No acute bony or joint abnormality. Small sclerotic density noted over the right acetabulum, most likely benign bone island. Oral contrast noted over the colon and appendix. IMPRESSION: Degenerative changes both hips.  No acute bony or joint abnormality. Electronically Signed   By: Maisie Fus  Register   On: 03/16/2021 13:02    Positive ROS: All other systems have been reviewed and were otherwise negative with the exception of those mentioned in the HPI and as above.  Physical Exam: General: Alert, no acute distress Cardiovascular: No pedal edema Respiratory: No cyanosis, no use of accessory musculature GI: No organomegaly, abdomen is soft and non-tender Skin: No lesions in the area of chief complaint Neurologic: Sensation intact distally Psychiatric:  Patient is competent for consent with normal mood and affect Lymphatic: No axillary or cervical lymphadenopathy  MUSCULOSKELETAL: Pain with IR/ER of the hip.. Compartments soft. Good cap refill. Motor and sensory intact distally.  Assessment: Right hip pain  Plan: Patient with relatively sudden onset hip pain without injury. No evidence of fracture or AVN. Effusion was noted on the CT. If there is a concern for infection would recommend getting interventional radiology for an aspiration of the joint and send for gram stain, culture and sensitivity.    Lyndle Herrlich, MD    03/16/2021 3:40 PM

## 2021-03-16 NOTE — Progress Notes (Signed)
Patient ID: Ruben [name], male   DOB: 1971-06-29, 50 y.o.   MRN: [id number]  PROGRESS NOTE    Ruben [name]  CWC:[id number] DOB: [birth year] DOA: 03/14/2021 PCP: Olena Leatherwood, FNP   Brief Narrative:  50 year old male with past medical history significant for cervical radiculopathy, history of nephrolithiasis who presented to [facility] ED on 5/2 with worsening right groin pain rating to right thigh with associated difficulty ambulating and weightbearing to right lower extremity.  On presentation, vascular duplex ultrasound of right lower extremity was negative for DVT.  CT abdomen/pelvis was negative for acute abnormality of pelvis or abdomen but noted nonobstructive right nephrolithiasis measuring 4 mm.  General surgery was consulted.  Assessment & Plan:   Right hip/groin pain Leukocytosis: Resolved - Vascular duplex ultrasound negative for DVT.  CT abdomen/pelvis unrevealing other than nonobstructing right nephrolithiasis.   -General surgery evaluation appreciated: Recommended MRI and orthopedics consultation.  Patient refused MRI -I have requested orthopedics consultation. -PT evaluation. -Patient continues to have significant hip pain.  Continue pain management  Normocytic anemia -Questionable cause.  Hemoglobin stable.  DVT prophylaxis: Lovenox Code Status: Full Family Communication: None at bedside Disposition Plan: Status is: Observation  The patient will require care spanning > 2 midnights and should be moved to inpatient because: Ongoing active pain requiring inpatient pain management  Dispo: The patient is from: Home              Anticipated d/c is to: Home              Patient currently is not medically stable to d/c.   Difficult to place patient No   Consultants: General surgery/orthopedics  Procedures: None  Antimicrobials: None   Subjective: Patient seen and examined at bedside.  Still complains of significant right hip pain.  No overnight fever, vomiting  or worsening shortness of breath reported.  Objective: Vitals:   03/15/21 1958 03/16/21 0402 03/16/21 0945 03/16/21 1125  BP: 135/84 (!) 132/91 118/75 120/74  Pulse: 89 87 92 80  Resp: 16 16 18 18   Temp: 98.6 F (37 C) 98.6 F (37 C) 98.6 F (37 C) 98 F (36.7 C)  TempSrc:   Oral Oral  SpO2: 95% 94% 96% 96%  Weight:      Height:        Intake/Output Summary (Last 24 hours) at 03/16/2021 1141 Last data filed at 03/16/2021 1017 Gross per 24 hour  Intake 960 ml  Output --  Net 960 ml   Filed Weights   03/14/21 2250  Weight: 105.7 kg    Examination:  General exam: Appears calm and comfortable  Respiratory system: Bilateral decreased breath sounds at bases Cardiovascular system: S1 & S2 heard, Rate controlled Gastrointestinal system: Abdomen is nondistended, soft and nontender. Normal bowel sounds heard. Extremities: No cyanosis, clubbing, edema.  Right hip decreased range of motion present.   Data Reviewed: I have personally reviewed following labs and imaging studies  CBC: Recent Labs  Lab 03/14/21 2252 03/16/21 0537  WBC 14.5* 9.8  NEUTROABS 12.8*  --   HGB 15.0 12.7*  HCT 43.2 37.4*  MCV 92.9 94.9  PLT 258 197   Basic Metabolic Panel: Recent Labs  Lab 03/14/21 2252 03/16/21 0537  NA 139 136  K 3.8 3.5  CL 105 103  CO2 22 27  GLUCOSE 144* 134*  BUN 10 9  CREATININE 1.05 0.94  CALCIUM 8.9 8.1*   GFR: Estimated Creatinine Clearance: 124.5 mL/min (by C-G formula based  on SCr of 0.94 mg/dL). Liver Function Tests: No results for input(s): AST, ALT, ALKPHOS, BILITOT, PROT, ALBUMIN in the last 168 hours. No results for input(s): LIPASE, AMYLASE in the last 168 hours. No results for input(s): AMMONIA in the last 168 hours. Coagulation Profile: No results for input(s): INR, PROTIME in the last 168 hours. Cardiac Enzymes: No results for input(s): CKTOTAL, CKMB, CKMBINDEX, TROPONINI in the last 168 hours. BNP (last 3 results) No results for input(s):  PROBNP in the last 8760 hours. HbA1C: No results for input(s): HGBA1C in the last 72 hours. CBG: No results for input(s): GLUCAP in the last 168 hours. Lipid Profile: No results for input(s): CHOL, HDL, LDLCALC, TRIG, CHOLHDL, LDLDIRECT in the last 72 hours. Thyroid Function Tests: No results for input(s): TSH, T4TOTAL, FREET4, T3FREE, THYROIDAB in the last 72 hours. Anemia Panel: No results for input(s): VITAMINB12, FOLATE, FERRITIN, TIBC, IRON, RETICCTPCT in the last 72 hours. Sepsis Labs: Recent Labs  Lab 03/15/21 0154  LATICACIDVEN 1.3    Recent Results (from the past 240 hour(s))  Culture, blood (routine x 2)     Status: None (Preliminary result)   Collection Time: 03/15/21  1:54 AM   Specimen: BLOOD  Result Value Ref Range Status   Specimen Description BLOOD RIGHT ANTECUBITAL  Final   Special Requests   Final    BOTTLES DRAWN AEROBIC AND ANAEROBIC Blood Culture adequate volume   Culture   Final    NO GROWTH 1 DAY Performed at [facility], [address] [place], [place] [address]    Report Status PENDING  Incomplete  Culture, blood (routine x 2)     Status: None (Preliminary result)   Collection Time: 03/15/21  1:55 AM   Specimen: BLOOD  Result Value Ref Range Status   Specimen Description BLOOD BLOOD RIGHT FOREARM  Final   Special Requests   Final    BOTTLES DRAWN AEROBIC AND ANAEROBIC Blood Culture adequate volume   Culture   Final    NO GROWTH 1 DAY Performed at [facility], [address], [place], [place] [address]    Report Status PENDING  Incomplete  Urine culture     Status: None   Collection Time: 03/15/21  1:55 AM   Specimen: Urine, Random  Result Value Ref Range Status   Specimen Description   Final    URINE, RANDOM Performed at [facility], [address], [place], [place] [address]    Special Requests   Final    NONE Performed at [facility], [address], [place], [place] [address]    Culture    Final    NO GROWTH Performed at [facility], [address] [place], [place] [address]    Report Status 03/16/2021 FINAL  Final  Resp Panel by RT-PCR (Flu A&B, Covid) Nasopharyngeal Swab     Status: None   Collection Time: 03/15/21  3:48 AM   Specimen: Nasopharyngeal Swab; Nasopharyngeal(NP) swabs in vial transport medium  Result Value Ref Range Status   SARS Coronavirus 2 by RT PCR NEGATIVE NEGATIVE Final    Comment: (NOTE) SARS-CoV-2 target nucleic acids are NOT DETECTED.  The SARS-CoV-2 RNA is generally detectable in upper respiratory specimens during the acute phase of infection. The lowest concentration of SARS-CoV-2 viral copies this assay can detect is 138 copies/mL. A negative result does not preclude SARS-Cov-2 infection and should not be used as the sole basis for treatment or other patient management decisions. A negative  result may occur with  improper specimen collection/handling, submission of specimen other than nasopharyngeal swab, presence of viral mutation(s) within the areas targeted by this assay, and inadequate number of viral copies(<138 copies/mL). A negative result must be combined with clinical observations, patient history, and epidemiological information. The expected result is Negative.  Fact Sheet for Patients:  BloggerCourse.comhttps://www.fda.gov/media/152166/download  Fact Sheet for Healthcare Providers:  SeriousBroker.ithttps://www.fda.gov/media/152162/download  This test is no t yet approved or cleared by the [place] FDA and  has been authorized for detection and/or diagnosis of SARS-CoV-2 by FDA under an Emergency Use Authorization (EUA). This EUA will remain  in effect (meaning this test can be used) for the duration of the COVID-19 declaration under Section 564(b)(1) of the Act, 21 U.S.C.section 360bbb-3(b)(1), unless the authorization is terminated  or revoked sooner.       Influenza A by PCR NEGATIVE NEGATIVE Final   Influenza B by PCR NEGATIVE NEGATIVE  Final    Comment: (NOTE) The Xpert Xpress SARS-CoV-2/FLU/RSV plus assay is intended as an aid in the diagnosis of influenza from Nasopharyngeal swab specimens and should not be used as a sole basis for treatment. Nasal washings and aspirates are unacceptable for Xpert Xpress SARS-CoV-2/FLU/RSV testing.  Fact Sheet for Patients: BloggerCourse.comhttps://www.fda.gov/media/152166/download  Fact Sheet for Healthcare Providers: SeriousBroker.ithttps://www.fda.gov/media/152162/download  This test is not yet approved or cleared by the [place] FDA and has been authorized for detection and/or diagnosis of SARS-CoV-2 by FDA under an Emergency Use Authorization (EUA). This EUA will remain in effect (meaning this test can be used) for the duration of the COVID-19 declaration under Section 564(b)(1) of the Act, 21 U.S.C. section 360bbb-3(b)(1), unless the authorization is terminated or revoked.  Performed at [facility], [address] [place], [place] [id number]          Radiology Studies: CT Abdomen Pelvis W Contrast  Result Date: 03/15/2021 CLINICAL DATA:  Right flank pain EXAM: CT ABDOMEN AND PELVIS WITH CONTRAST TECHNIQUE: Multidetector CT imaging of the abdomen and pelvis was performed using the standard protocol following bolus administration of intravenous contrast. CONTRAST:  125mL OMNIPAQUE IOHEXOL 300 MG/ML  SOLN COMPARISON:  None. FINDINGS: LOWER CHEST: Normal. HEPATOBILIARY: Normal hepatic contours. No intra- or extrahepatic biliary dilatation. Status post cholecystectomy. PANCREAS: Normal pancreas. No ductal dilatation or peripancreatic fluid collection. SPLEEN: Normal. ADRENALS/URINARY TRACT: The adrenal glands are normal. Nonobstructive right nephrolithiasis measuring up to 4 mm. No left nephroureterolithiasis. No hydronephrosis. Normal urinary bladder. STOMACH/BOWEL: There is no hiatal hernia. Normal duodenal course and caliber. No small bowel dilatation or inflammation. No focal colonic  abnormality. Normal appendix. VASCULAR/LYMPHATIC: Normal course and caliber of the major abdominal vessels. No abdominal or pelvic lymphadenopathy. REPRODUCTIVE: Normal prostate size with symmetric seminal vesicles. MUSCULOSKELETAL. No bony spinal canal stenosis or focal osseous abnormality. OTHER: None. IMPRESSION: 1. No acute abnormality of the abdomen or pelvis. 2. Nonobstructive right nephrolithiasis measuring up to 4 mm. Electronically Signed   By: Deatra RobinsonKevin  Herman M.D.   On: 03/15/2021 03:23   US Venous Img Lower Unilateral Right  Result Date: 03/15/2021 CLINICAL DATA:  Right leg swelling and pain EXAM: RIGHT LOWER EXTREMITY VENOUS DOPPLER ULTRASOUND TECHNIQUE: Gray-scale sonography with compression, as well as color and duplex ultrasound, were performed to evaluate the deep venous system(s) from the level of the common femoral vein through the popliteal and proximal calf veins. COMPARISON:  None. FINDINGS: VENOUS Normal compressibility of the common femoral, superficial femoral, and popliteal veins, as well as the visualized calf veins. Visualized portions  of profunda femoral vein and great saphenous vein unremarkable. No filling defects to suggest DVT on grayscale or color Doppler imaging. Doppler waveforms show normal direction of venous flow, normal respiratory plasticity and response to augmentation. Limited views of the contralateral common femoral vein are unremarkable. OTHER None. Limitations: none IMPRESSION: Negative. Electronically Signed   By: Kreg Shropshire M.D.   On: 03/15/2021 01:45   CT NO CHARGE  Result Date: 03/15/2021 CLINICAL DATA:  Hip pain with osteonecrosis suspected EXAM: CT ADDITIONAL VIEWS AT NO CHARGE TECHNIQUE: Reformatted images of the right hip were generated from a CT of the abdomen and pelvis with contrast. CONTRAST:  None additional COMPARISON:  None. FINDINGS: Right hip joint effusion. No underlying fracture, or erosion, or subchondral lucency. No signs of osteonecrosis. No  evidence of skin or muscular inflammation. No significant degenerative hip narrowing or spurring. Mild sacroiliac degenerative spurring. IMPRESSION: Right hip joint effusion of indeterminate cause. No superimposed fracture or erosion. Electronically Signed   By: Marnee Spring M.D.   On: 03/15/2021 11:09        Scheduled Meds: . enoxaparin (LOVENOX) injection  40 mg Subcutaneous Q24H   Continuous Infusions:        Glade Lloyd, MD [facility] 03/16/2021, 11:41 AM

## 2021-03-16 NOTE — Evaluation (Signed)
Occupational Therapy Evaluation Patient Details Name: Ruben [name] MRN: [id number] DOB: 17-May-1971 Today's Date: 03/16/2021    History of Present Illness presented to ER secondary to acute-onset R groin pain, difficulty WBing and ambulating; admitted for pain management and medical work up of R groin pain.  CT abdomen/pelvic negative for acute pathology (surgery recommending ortho consult-pending-for consideration of hip pathology); patient currently refusing MRI to evaluate further.   Clinical Impression   Mr Hubble was seen for OT evaluation this date. Prior to hospital admission, pt was Independent for mobility and I/ADLs including driving. Pt lives with his son and sons' mother, available PRN, in home c 1 STE back, 8 STE front. Pt requires MOD A don R sock seated EOC, MOD INDEPENDENT don L sock. CGA + RW for ADL t/f. Single UE support standing dynamic tasks reaching inside BOS. Pt reports 8/10 pain in R hip during mobility. Upon hospital discharge, recommend No OT follow up. Pt presents to acute OT near baseline functional mobility/balance, limited by pain only. Will sign off at this time.      Follow Up Recommendations  No OT follow up    Equipment Recommendations  3 in 1 bedside commode    Recommendations for Other Services       Precautions / Restrictions Precautions Precautions: None Restrictions Weight Bearing Restrictions: No      Mobility Bed Mobility Overal bed mobility: Needs Assistance Bed Mobility: Supine to Sit     Supine to sit: Supervision;HOB elevated     General bed mobility comments: increased time and requires use of bed rail, foot board to pull self into sitting.    Transfers Overall transfer level: Needs assistance Equipment used: Rolling walker (2 wheeled) Transfers: Sit to/from Stand Sit to Stand: Min guard         General transfer comment: bed rail + RW    Balance Overall balance assessment: Needs assistance Sitting-balance support:  No upper extremity supported;Feet supported Sitting balance-Leahy Scale: Good     Standing balance support: Bilateral upper extremity supported Standing balance-Leahy Scale: Fair                             ADL either performed or assessed with clinical judgement   ADL Overall ADL's : Needs assistance/impaired                                       General ADL Comments: MOD A don R sock seated EOC, MOD I don L sock. CGA + RW for ADL t/f. Single UE support standing grooming tasks.                  Pertinent Vitals/Pain Pain Assessment: 0-10 Pain Score: 8  Pain Location: R hip/groin Pain Descriptors / Indicators: Aching;Grimacing;Guarding Pain Intervention(s): Limited activity within patient's tolerance;Repositioned;Patient requesting pain meds-RN notified     Hand Dominance Right   Extremity/Trunk Assessment Upper Extremity Assessment Upper Extremity Assessment: Overall WFL for tasks assessed   Lower Extremity Assessment Lower Extremity Assessment: RLE deficits/detail RLE: Unable to fully assess due to pain       Communication Communication Communication: No difficulties   Cognition Arousal/Alertness: Awake/alert Behavior During Therapy: WFL for tasks assessed/performed Overall Cognitive Status: Within Functional Limits for tasks assessed  Exercises Exercises: Other exercises Other Exercises Other Exercises: Pt educated re: OT role, DME recs, d/c recs, falls prevention, ECS, adapted dressing tehcniques Other Exercises: LBD, sup>sit, sit<>stand, sitting/standing balance/tolerance        Home Living Family/patient expects to be discharged to:: Private residence Living Arrangements: Spouse/significant other;Children Available Help at Discharge: Family;Available PRN/intermittently Type of Home: House Home Access: Stairs to enter [employer] of Steps: 7-8 in front, 1  in back   Home Layout: One level               Home Equipment: None          Prior Functioning/Environment Level of Independence: Independent        Comments: Indep with ADLs, household and community mobilization without assist device; denies recent fall history, R hip injury; + driving. Pt reports sleeping on floor at baseline, not employed        OT Problem List: Decreased range of motion;Impaired balance (sitting and/or standing)         OT Goals(Current goals can be found in the care plan section) Acute Rehab OT Goals Patient Stated Goal: to figure out what's going on and fix this pain OT Goal Formulation: With patient Time For Goal Achievement: 03/30/21 Potential to Achieve Goals: Good ADL Goals Pt Will Perform Grooming: Independently;standing Pt Will Perform Lower Body Dressing: Independently;sit to/from stand Pt Will Transfer to Toilet: Independently;ambulating;regular height toilet   AM-PAC OT "6 Clicks" Daily Activity     Outcome Measure Help from another person eating meals?: None Help from another person taking care of personal grooming?: None Help from another person toileting, which includes using toliet, bedpan, or urinal?: A Little Help from another person bathing (including washing, rinsing, drying)?: None Help from another person to put on and taking off regular upper body clothing?: None Help from another person to put on and taking off regular lower body clothing?: A Lot 6 Click Score: 21   End of Session Equipment Utilized During Treatment: Rolling walker Nurse Communication: Patient requests pain meds  Activity Tolerance: Patient tolerated treatment well Patient left: in chair;with call bell/phone within reach  OT Visit Diagnosis: Other abnormalities of gait and mobility (R26.89)                Time: 0932-6712 OT Time Calculation (min): 27 min Charges:  OT General Charges $OT Visit: 1 Visit OT Evaluation $OT Eval Low Complexity: 1  Low OT Treatments $Self Care/Home Management : 8-22 mins  Kathie Dike, M.S. OTR/L  03/16/21, 12:58 PM  ascom [phone]

## 2021-03-17 DIAGNOSIS — M25551 Pain in right hip: Secondary | ICD-10-CM

## 2021-03-17 DIAGNOSIS — M25451 Effusion, right hip: Principal | ICD-10-CM

## 2021-03-17 LAB — CBC WITH DIFFERENTIAL/PLATELET
Abs Immature Granulocytes: 0.08 10*3/uL — ABNORMAL HIGH (ref 0.00–0.07)
Basophils Absolute: 0.1 10*3/uL (ref 0.0–0.1)
Basophils Relative: 1 %
Eosinophils Absolute: 0.2 10*3/uL (ref 0.0–0.5)
Eosinophils Relative: 2 %
HCT: 41.2 % (ref 39.0–52.0)
Hemoglobin: 14.1 g/dL (ref 13.0–17.0)
Immature Granulocytes: 1 %
Lymphocytes Relative: 25 %
Lymphs Abs: 2.4 10*3/uL (ref 0.7–4.0)
MCH: 32.3 pg (ref 26.0–34.0)
MCHC: 34.2 g/dL (ref 30.0–36.0)
MCV: 94.3 fL (ref 80.0–100.0)
Monocytes Absolute: 0.9 10*3/uL (ref 0.1–1.0)
Monocytes Relative: 9 %
Neutro Abs: 6 10*3/uL (ref 1.7–7.7)
Neutrophils Relative %: 62 %
Platelets: 222 10*3/uL (ref 150–400)
RBC: 4.37 MIL/uL (ref 4.22–5.81)
RDW: 11.9 % (ref 11.5–15.5)
WBC: 9.6 10*3/uL (ref 4.0–10.5)
nRBC: 0 % (ref 0.0–0.2)

## 2021-03-17 LAB — COMPREHENSIVE METABOLIC PANEL
ALT: 36 U/L (ref 0–44)
AST: 16 U/L (ref 15–41)
Albumin: 3.3 g/dL — ABNORMAL LOW (ref 3.5–5.0)
Alkaline Phosphatase: 52 U/L (ref 38–126)
Anion gap: 7 (ref 5–15)
BUN: 12 mg/dL (ref 6–20)
CO2: 30 mmol/L (ref 22–32)
Calcium: 8.8 mg/dL — ABNORMAL LOW (ref 8.9–10.3)
Chloride: 103 mmol/L (ref 98–111)
Creatinine, Ser: 1 mg/dL (ref 0.61–1.24)
GFR, Estimated: >60 mL/min (ref >60)
Glucose, Bld: 111 mg/dL — ABNORMAL HIGH (ref 70–99)
Potassium: 4.3 mmol/L (ref 3.5–5.1)
Sodium: 140 mmol/L (ref 135–145)
Total Bilirubin: 0.5 mg/dL (ref 0.3–1.2)
Total Protein: 6.9 g/dL (ref 6.5–8.1)

## 2021-03-17 LAB — C-REACTIVE PROTEIN: CRP: 10.8 mg/dL — ABNORMAL HIGH (ref <1.0)

## 2021-03-17 LAB — MAGNESIUM: Magnesium: 2.3 mg/dL (ref 1.7–2.4)

## 2021-03-17 MED ORDER — OXYCODONE HCL 5 MG PO TABS: 5.0000 mg | ORAL_TABLET | Freq: Four times a day (QID) | ORAL | 0 refills | Status: AC | PRN

## 2021-03-17 NOTE — TOC Transition Note (Signed)
Transition of Care [facility]) - CM/SW Discharge Note   Patient Details  Name: Ruben [name] MRN: [id number] Date of Birth: 1971/09/06  [facility] CM/SW Contact:  Alberteen Sam, LCSW Phone Number: 03/17/2021, 11:54 AM   Clinical Narrative:     Met with patient at bedside, is ready to be discharged. Patient reports he has PCP he follows up with in [facility], reports he lives with his son and his mother and that his grandfather is picking him up today. Reports grandfather is currently at hospital. Declined home health services, reports he can get around and demonstrated this to [place]. CSW informed patient that PT recommended walker and bedside commode, patient agreeable to walker but not bedside commode. Patient provided with [facility] charity walker, Armida Sans informed. Patient was pushed out by [facility] team to grandfather's truck, no further discharge needs identified.   Final next level of care: Home/Self Care Barriers to Discharge: No Barriers Identified   Patient Goals and CMS Choice Patient states their goals for this hospitalization and ongoing recovery are:: to go home CMS Medicare.gov Compare Post Acute Care list provided to:: Patient Choice offered to / list presented to : Patient  Discharge Placement                       Discharge Plan and Services                DME Arranged: Walker rolling DME Agency:  ([place]) Date DME Agency Contacted: 03/17/21 Time DME Agency Contacted: 1150 Representative spoke with at DME Agency: [place] ([place]) Interventions     Readmission Risk Interventions No flowsheet data found.

## 2021-03-17 NOTE — Progress Notes (Signed)
Pt prepared for discharge but he needs a BSC and a walker at home. Pt outside his room in a wheelchair that he "found outside his room" and a walker from the room.  He states that he is ready to go although his ride is not here.  Assisted pt back to his room and told him that he will have to wait until his walker and BSC are delivered.

## 2021-03-17 NOTE — Progress Notes (Signed)
Pt discharged home with his grandfather.  All discharge instructions explained and questions answered. Walker provided by CM/SW and escorted out via wheelchair by the CM/SW.

## 2021-03-17 NOTE — Discharge Summary (Signed)
Physician Discharge Summary  Patient ID: Ruben [name] MRN: [id number] DOB/AGE: 1971/11/10 50 y.o.  Admit date: 03/14/2021 Discharge date: 03/17/2021  Admission Diagnoses:  Discharge Diagnoses:  Active Problems:   Severe right groin pain   Right hip joint effusion   Right hip pain   Discharged Condition: good  Hospital Course:  50 year old male with past medical history significant for cervical radiculopathy, history of nephrolithiasis who presented to [facility] ED on5/2with worsening right groin pain rating to right thigh with associated difficulty ambulating and weightbearing to right lower extremity.  On presentation, vascular duplex ultrasound of right lower extremity was negative for DVT.  CT abdomen/pelvis was negative for acute abnormality of pelvis or abdomen but noted nonobstructive right nephrolithiasis measuring 4 mm.  General surgery was consulted. CT scan also showed right hip joint effusion, patient was referred to orthopedic surgery.  Scheduled joint aspiration. However, patient refused additional MRI test or joint aspiration.  I spent 30 minutes time with the him try to convince him, he states that he will never do it here.  He will go to [employer] under [employer] so that he does not have to pay.  I explained to him that before he has no income, he he may not need to pay in hospital, however, he still refused it.  At this point, I will discharge him with some pain medicine until he can be seen by physician in [facility].    Consults: General Surgery, orthopedics.  Significant Diagnostic Studies:  DG HIP (WITH OR WITHOUT PELVIS) 2-3V RIGHT  COMPARISON:  CT 03/15/2021, 06/05/2020.  FINDINGS: Degenerative change both hips. No acute bony or joint abnormality. Small sclerotic density noted over the right acetabulum, most likely benign bone island. Oral contrast noted over the colon and appendix.  IMPRESSION: Degenerative changes both hips.  No acute bony or  joint abnormality.  CT ADDITIONAL VIEWS AT NO CHARGE  TECHNIQUE: Reformatted images of the right hip were generated from a CT of the abdomen and pelvis with contrast.  CONTRAST:  None additional  COMPARISON:  None.  FINDINGS: Right hip joint effusion. No underlying fracture, or erosion, or subchondral lucency. No signs of osteonecrosis. No evidence of skin or muscular inflammation. No significant degenerative hip narrowing or spurring. Mild sacroiliac degenerative spurring.  IMPRESSION: Right hip joint effusion of indeterminate cause. No superimposed fracture or erosion.   Electronically Signed   By: Marnee Spring M.D.   On: 03/15/2021 11:09  CT ABDOMEN AND PELVIS WITH CONTRAST  TECHNIQUE: Multidetector CT imaging of the abdomen and pelvis was performed using the standard protocol following bolus administration of intravenous contrast.  CONTRAST:  OMNIPAQUE IOHEXOL 300 MG/ML  SOLN  COMPARISON:  None.  FINDINGS: LOWER CHEST: Normal.  HEPATOBILIARY: Normal hepatic contours. No intra- or extrahepatic biliary dilatation. Status post cholecystectomy.  PANCREAS: Normal pancreas. No ductal dilatation or peripancreatic fluid collection.  SPLEEN: Normal.  ADRENALS/URINARY TRACT: The adrenal glands are normal. Nonobstructive right nephrolithiasis measuring up to 4 mm. No left nephroureterolithiasis. No hydronephrosis. Normal urinary bladder.  STOMACH/BOWEL: There is no hiatal hernia. Normal duodenal course and caliber. No small bowel dilatation or inflammation. No focal colonic abnormality. Normal appendix.  VASCULAR/LYMPHATIC: Normal course and caliber of the major abdominal vessels. No abdominal or pelvic lymphadenopathy.  REPRODUCTIVE: Normal prostate size with symmetric seminal vesicles.  MUSCULOSKELETAL. No bony spinal canal stenosis or focal osseous abnormality.  OTHER: None.  IMPRESSION: 1. No acute abnormality of the  abdomen or pelvis. 2. Nonobstructive  right nephrolithiasis measuring up to 4 mm.   Electronically Signed   By: Deatra Robinson M.D.   On: 03/15/2021 03:23  RIGHT LOWER EXTREMITY VENOUS DOPPLER ULTRASOUND  TECHNIQUE: Gray-scale sonography with compression, as well as color and duplex ultrasound, were performed to evaluate the deep venous system(s) from the level of the common femoral vein through the popliteal and proximal calf veins.  COMPARISON:  None.  FINDINGS: VENOUS  Normal compressibility of the common femoral, superficial femoral, and popliteal veins, as well as the visualized calf veins. Visualized portions of profunda femoral vein and great saphenous vein unremarkable. No filling defects to suggest DVT on grayscale or color Doppler imaging. Doppler waveforms show normal direction of venous flow, normal respiratory plasticity and response to augmentation.  Limited views of the contralateral common femoral vein are unremarkable.  OTHER  None.  Limitations: none  IMPRESSION: Negative.   Electronically Signed   By: Kreg Shropshire M.D.   On: 03/15/2021 01:45   Treatments: Symptomatic treatment.  Discharge Exam: Blood pressure 119/82, pulse 67, temperature 98.3 F (36.8 C), resp. rate 16, height 6\' 2"  (1.88 m), weight 105.7 kg, SpO2 98 %. General appearance: alert and cooperative Resp: clear to auscultation bilaterally Cardio: regular rate and rhythm, S1, S2 normal, no murmur, click, rub or gallop GI: soft, non-tender; bowel sounds normal; no masses,  no organomegaly Extremities: extremities normal, atraumatic, no cyanosis or edema  Disposition: Discharge disposition: 01-Home or Self Care       Discharge Instructions    Diet - low sodium heart healthy   Complete by: As directed    Increase activity slowly   Complete by: As directed      Allergies as of 03/17/2021   No Known Allergies     Medication List    TAKE these medications    oxyCODONE 5 MG immediate release tablet Commonly known as: Oxy IR/ROXICODONE Take 1 tablet (5 mg total) by mouth every 6 (six) hours as needed for moderate pain or severe pain.       Follow-up Information    05/17/2021, FNP Follow up in 1 week(s).   Specialty: Nurse Practitioner Contact information: [address] [place] [phone]               Signed: [phone] 03/17/2021, 9:17 AM

## 2021-03-17 NOTE — Progress Notes (Signed)
Pt very upset and hungry.  He states that he does not want to have the aspiration done today and he wants to go to [facility] to have it done.  MD informed of pt's wishes.

## 2021-03-20 LAB — CULTURE, BLOOD (ROUTINE X 2)
Culture: NO GROWTH
Culture: NO GROWTH
Special Requests: ADEQUATE
Special Requests: ADEQUATE

## 2021-07-19 ENCOUNTER — Emergency Department: Payer: Self-pay

## 2021-07-19 ENCOUNTER — Encounter: Payer: Self-pay | Admitting: Emergency Medicine

## 2021-07-19 ENCOUNTER — Other Ambulatory Visit: Payer: Self-pay

## 2021-07-19 ENCOUNTER — Emergency Department
Admission: EM | Admit: 2021-07-19 | Discharge: 2021-07-19 | Disposition: A | Discharge status: Home / Self Care | Payer: Self-pay | Attending: Emergency Medicine | Admitting: Emergency Medicine

## 2021-07-19 DIAGNOSIS — M79605 Pain in left leg: Secondary | ICD-10-CM | POA: Insufficient documentation

## 2021-07-19 DIAGNOSIS — M25552 Pain in left hip: Secondary | ICD-10-CM | POA: Insufficient documentation

## 2021-07-19 DIAGNOSIS — M25511 Pain in right shoulder: Secondary | ICD-10-CM | POA: Insufficient documentation

## 2021-07-19 DIAGNOSIS — Z5321 Procedure and treatment not carried out due to patient leaving prior to being seen by health care provider: Secondary | ICD-10-CM | POA: Insufficient documentation

## 2021-07-19 LAB — BASIC METABOLIC PANEL
Anion gap: 10 (ref 5–15)
BUN: 9 mg/dL (ref 6–20)
CO2: 25 mmol/L (ref 22–32)
Calcium: 8.6 mg/dL — ABNORMAL LOW (ref 8.9–10.3)
Chloride: 101 mmol/L (ref 98–111)
Creatinine, Ser: 0.83 mg/dL (ref 0.61–1.24)
GFR, Estimated: >60 mL/min (ref >60)
Glucose, Bld: 129 mg/dL — ABNORMAL HIGH (ref 70–99)
Potassium: 3.2 mmol/L — ABNORMAL LOW (ref 3.5–5.1)
Sodium: 136 mmol/L (ref 135–145)

## 2021-07-19 LAB — CBC WITH DIFFERENTIAL/PLATELET
Abs Immature Granulocytes: 0.03 10*3/uL (ref 0.00–0.07)
Basophils Absolute: 0 10*3/uL (ref 0.0–0.1)
Basophils Relative: 0 %
Eosinophils Absolute: 0.2 10*3/uL (ref 0.0–0.5)
Eosinophils Relative: 2 %
HCT: 41.6 % (ref 39.0–52.0)
Hemoglobin: 14.7 g/dL (ref 13.0–17.0)
Immature Granulocytes: 0 %
Lymphocytes Relative: 20 %
Lymphs Abs: 2.2 10*3/uL (ref 0.7–4.0)
MCH: 32.2 pg (ref 26.0–34.0)
MCHC: 35.3 g/dL (ref 30.0–36.0)
MCV: 91.2 fL (ref 80.0–100.0)
Monocytes Absolute: 0.8 10*3/uL (ref 0.1–1.0)
Monocytes Relative: 7 %
Neutro Abs: 7.8 10*3/uL — ABNORMAL HIGH (ref 1.7–7.7)
Neutrophils Relative %: 71 %
Platelets: 298 10*3/uL (ref 150–400)
RBC: 4.56 MIL/uL (ref 4.22–5.81)
RDW: 11.9 % (ref 11.5–15.5)
WBC: 11 10*3/uL — ABNORMAL HIGH (ref 4.0–10.5)
nRBC: 0 % (ref 0.0–0.2)

## 2021-07-19 NOTE — ED Triage Notes (Signed)
Pt reports that since yesterday he has had left leg pain and right shoulder pain. He reports that the arm hurts so bad that he can not move it. He has been putting pain patches on his arms to help but they have not helped. No known injury. He also reports that his left hip is also hurting and no injury to that also.

## 2021-07-19 NOTE — ED Notes (Signed)
Pt called multiple times by xray with no answer, pt is not seen in the [facility] on outside,

## 2021-07-28 IMAGING — CR DG SHOULDER 2+V*L*
3 series · 3 of 3 positions shown · non-contrast
Comparison: Chest radiograph dated 11/23/2019.

CLINICAL DATA: 49-year-old male with left shoulder pain.

EXAM:
LEFT SHOULDER - 2+ VIEW

[shoulder grashey]
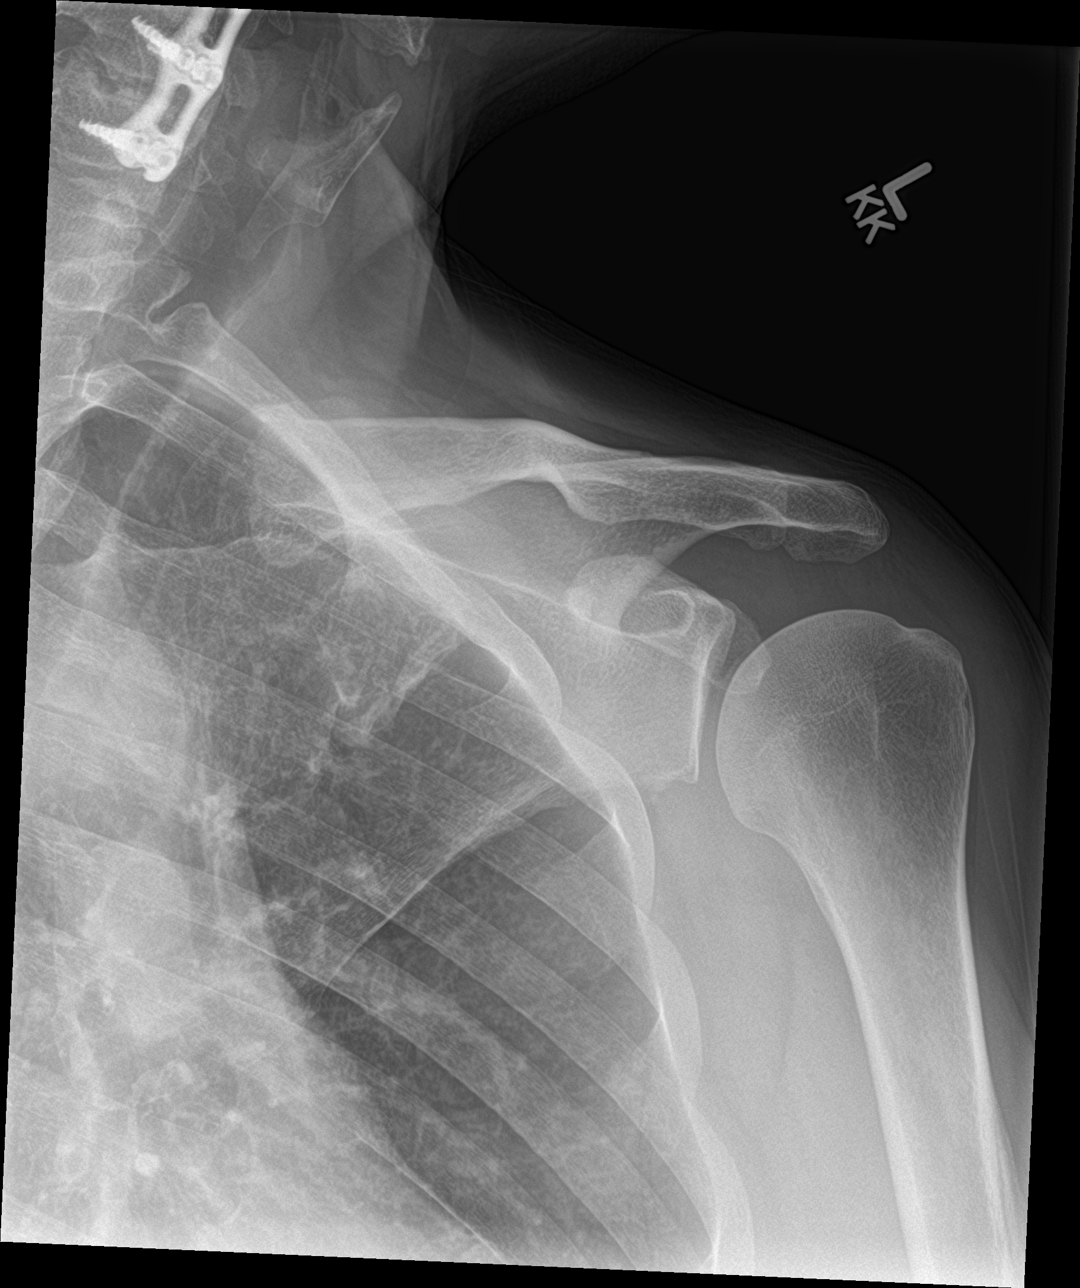

[shoulder y view]
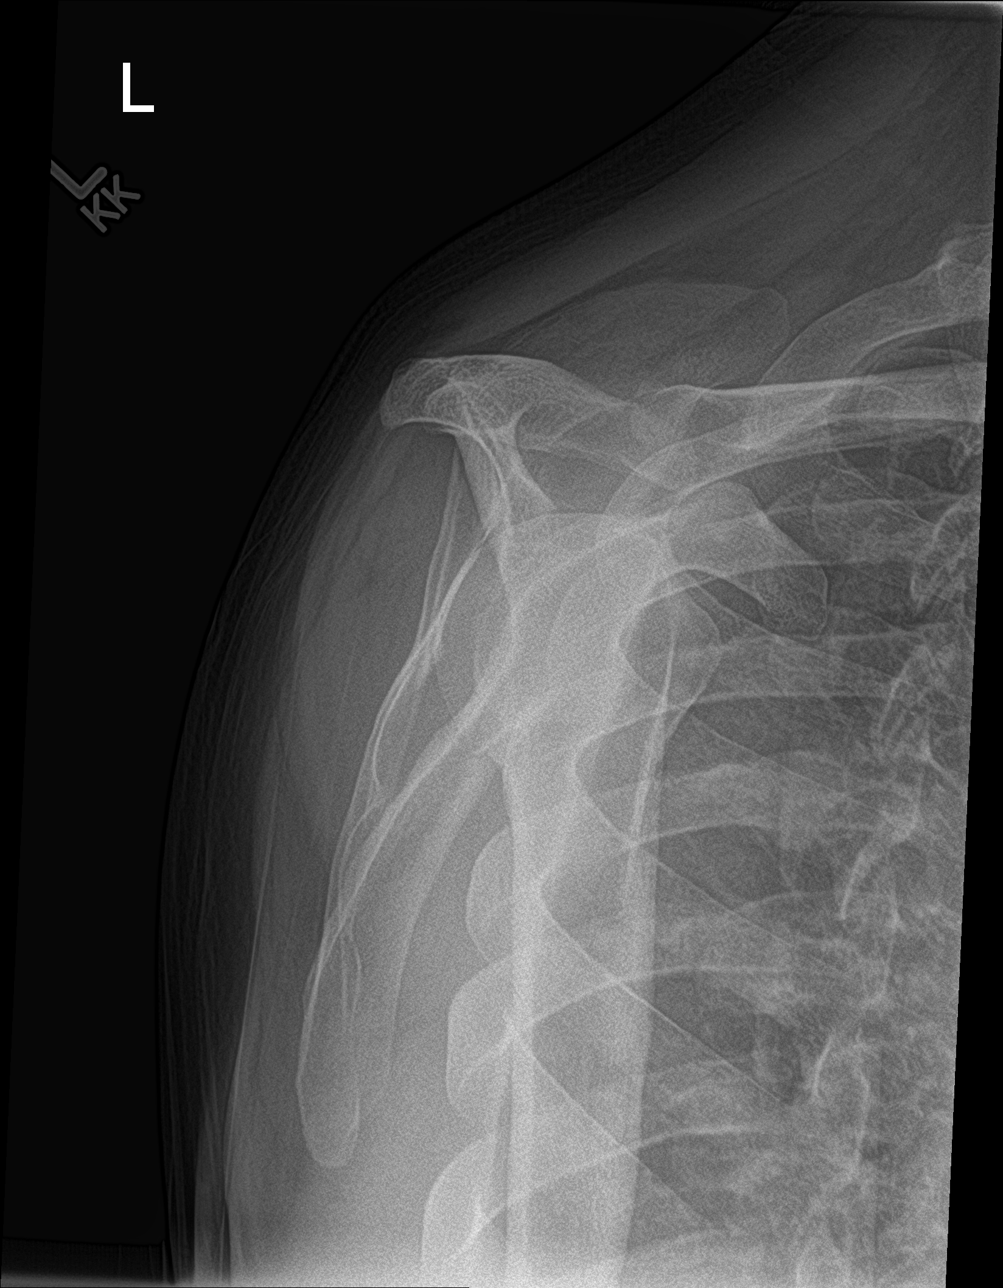

[shoulder axillary]
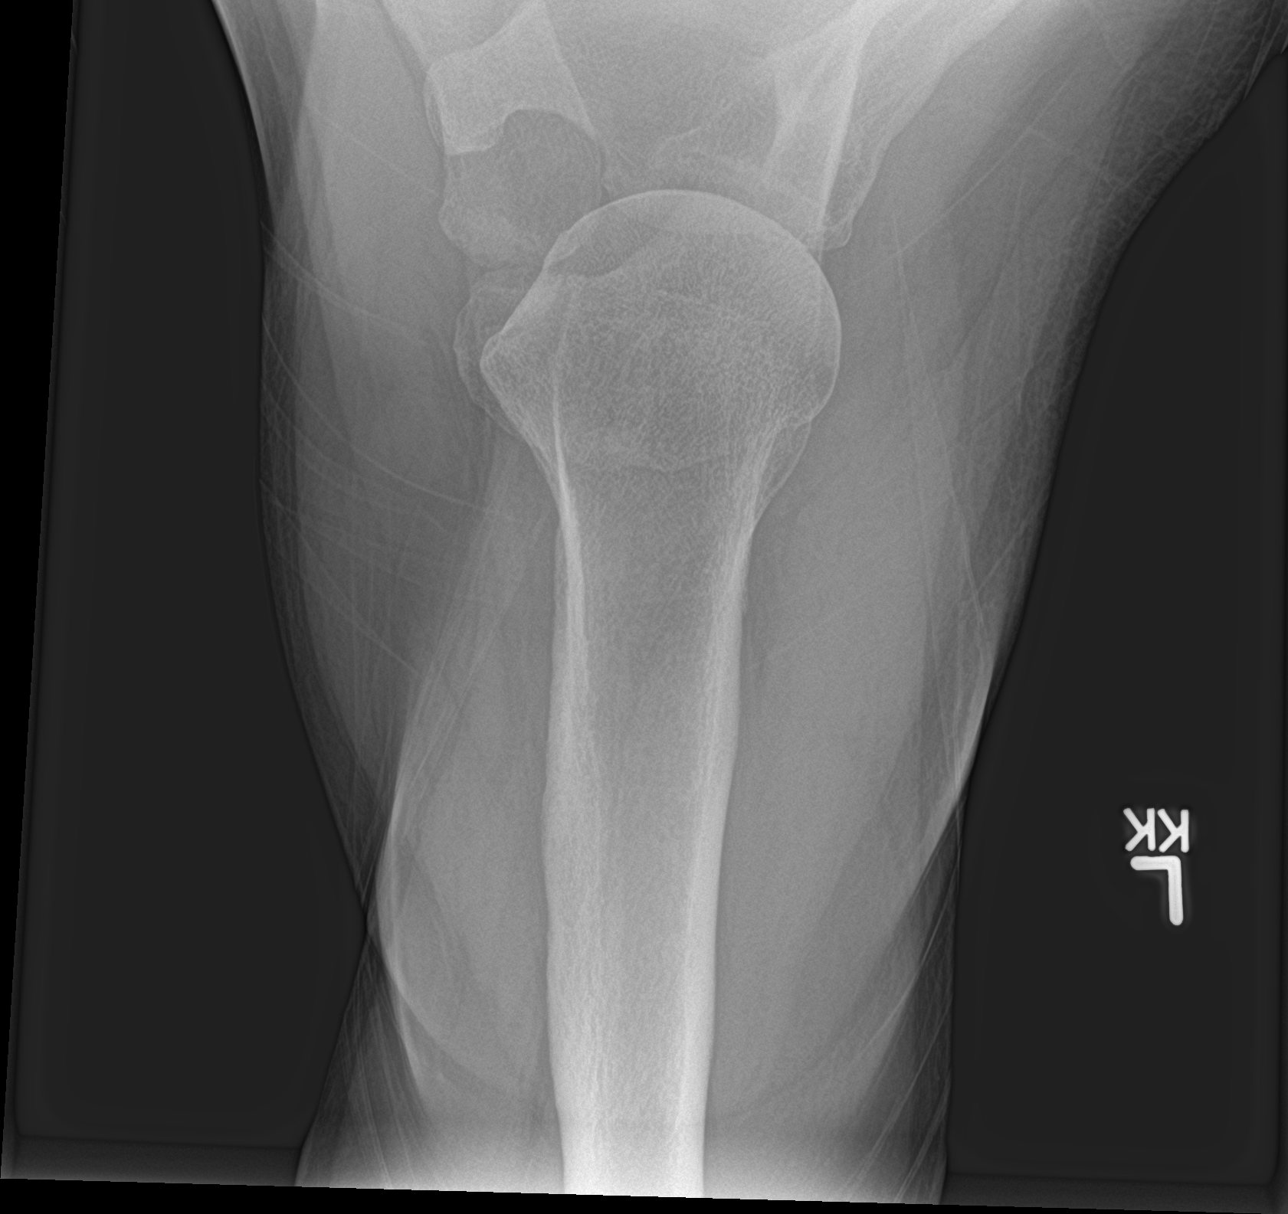

[3 of 3 positions shown; findings below may reference images not displayed]

FINDINGS: There is no acute fracture or dislocation. The bones are well
mineralized. No significant arthritic changes. Partially visualized
cervical ACDF. The soft tissues are unremarkable.
IMPRESSION: No acute/traumatic osseous pathology.

## 2021-07-28 IMAGING — CR DG CERVICAL SPINE 2 OR 3 VIEWS
3 series · 4 of 4 positions shown · non-contrast
Comparison: 09/10/2019

CLINICAL DATA: Left shoulder pain.

EXAM:
CERVICAL SPINE - 2-3 VIEW

[c-spine lat]
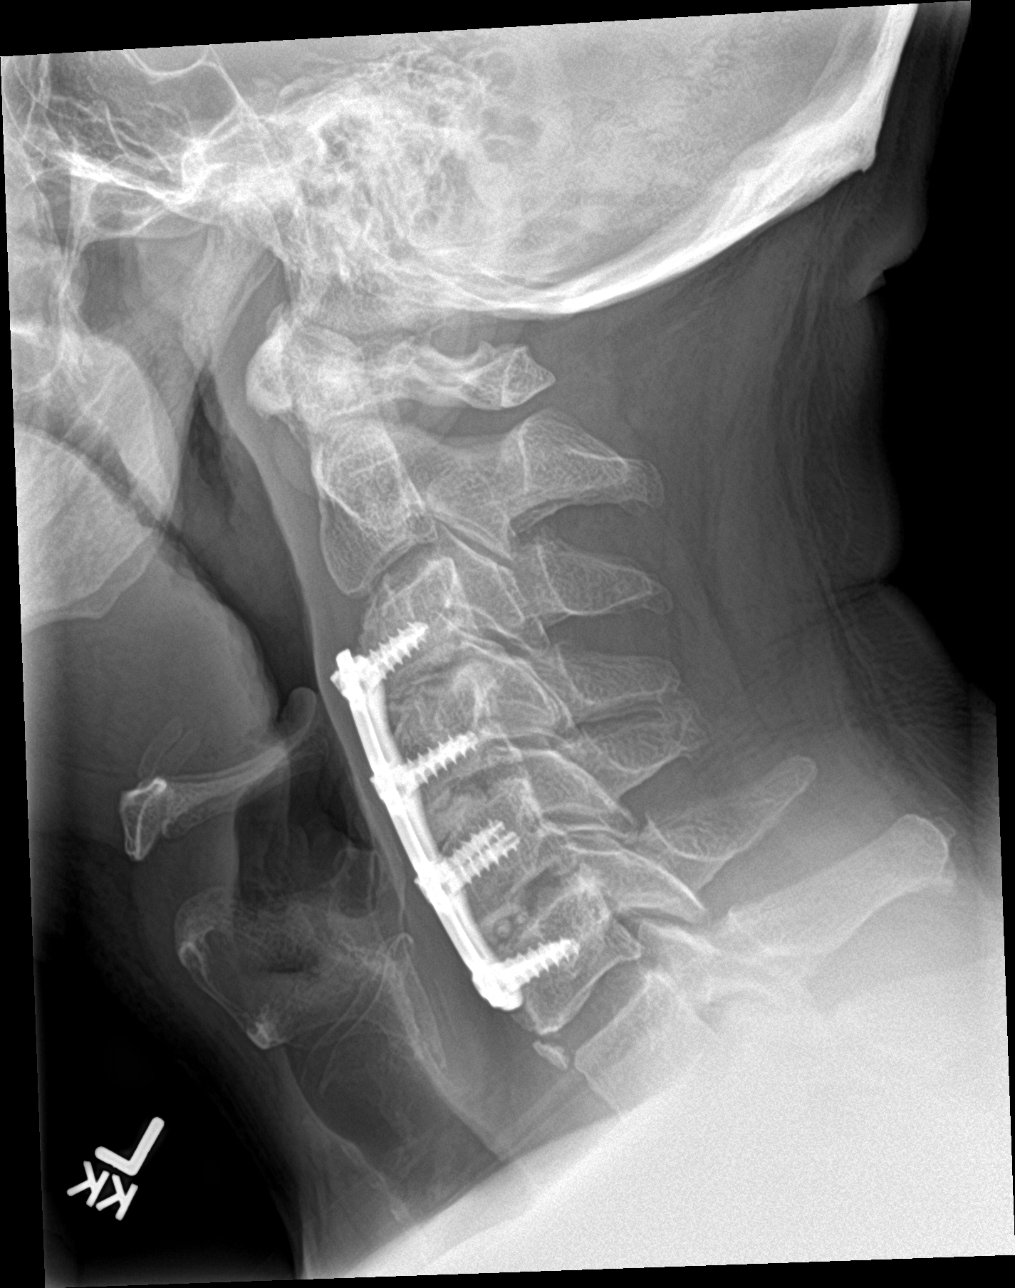

[c-spine ap]
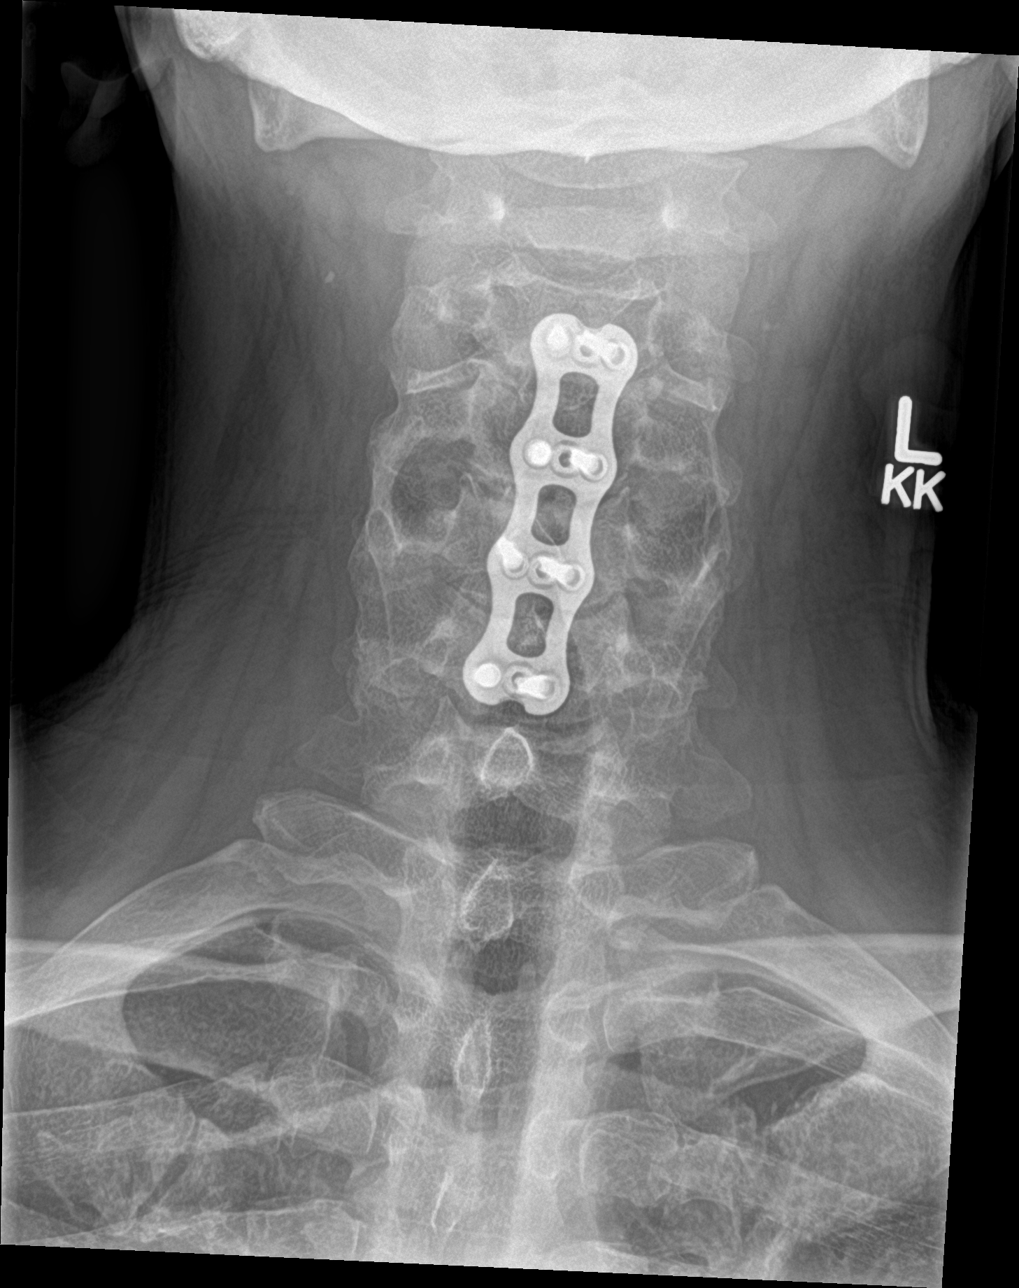

[Series 3: c-spine open mouth · 0.14mm/px · 2 of 2 slices shown]
[im 1/2]
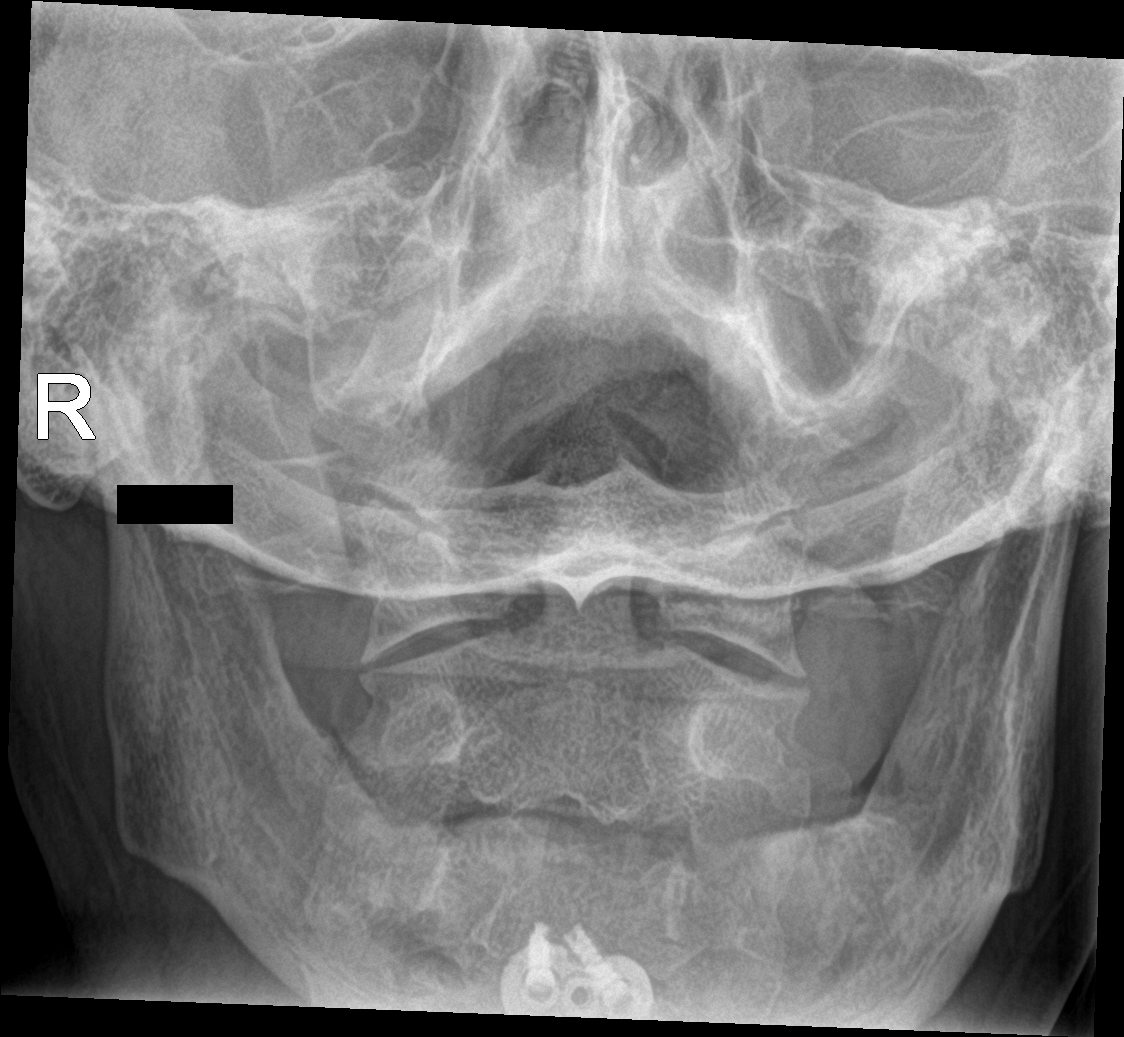
[im 2/2]
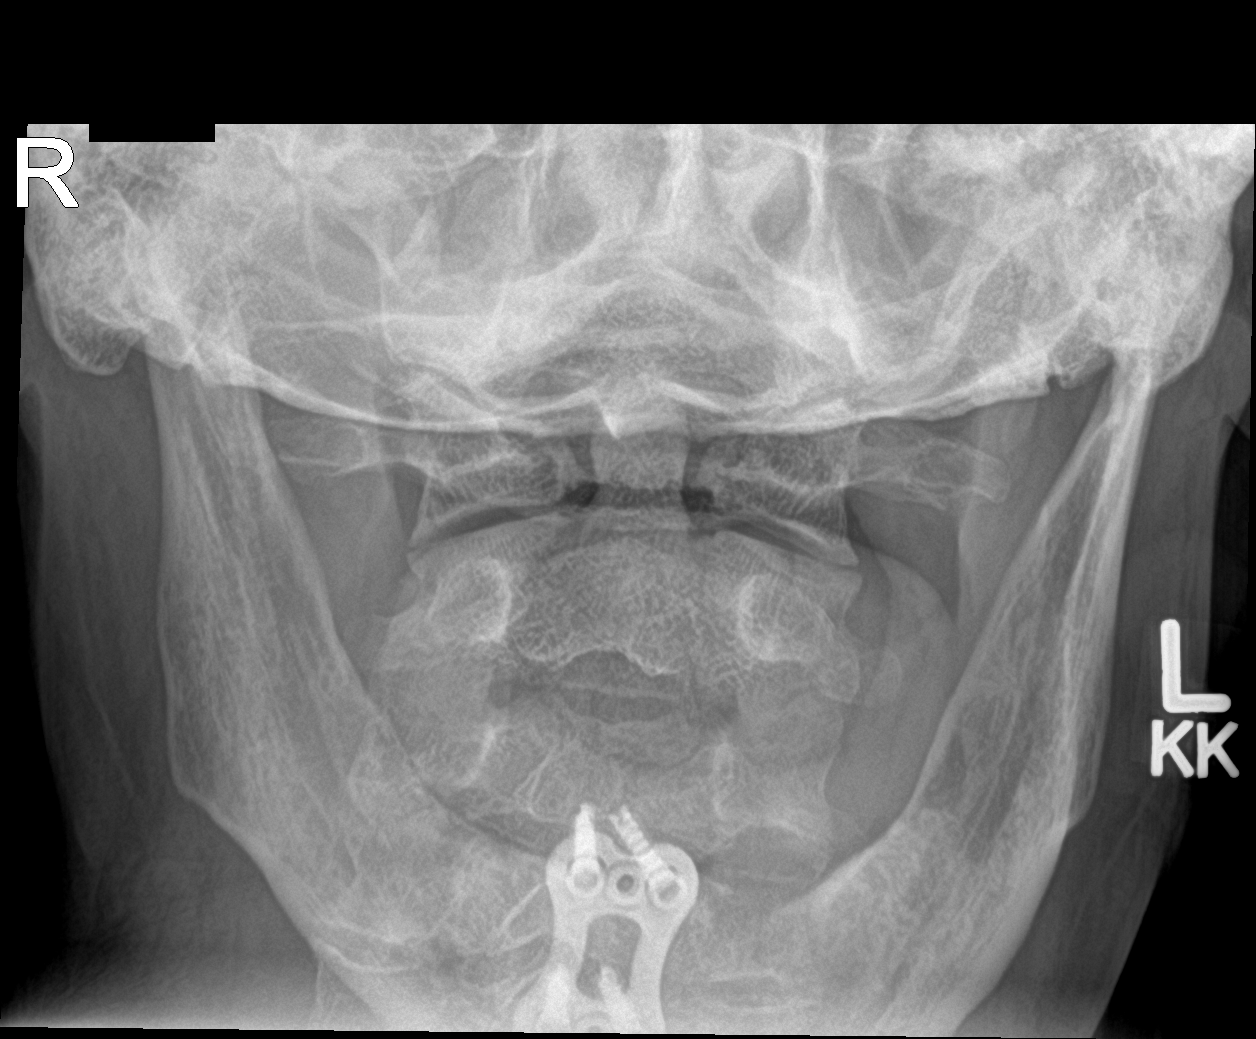

[4 of 4 positions shown; findings below may reference images not displayed]

FINDINGS: Three views study again shows C3-6 anterior cervical discectomy with
anterior plate. No evidence for fracture or subluxation. Alignment
is stable compared to 09/10/2019. No prevertebral soft tissue
swelling.
IMPRESSION: No acute findings.  Anterior cervical fusion from C3-C6.
